# Patient Record
Sex: Female | Born: 1965 | Race: White | Hispanic: No | Marital: Married
Health system: Midwestern US, Academic
[De-identification: ages and names within clinical notes are randomized; demographics above are authoritative.]

---

## 2016-09-15 ENCOUNTER — Encounter: Admit: 2016-09-15 | Discharge: 2016-09-15

## 2016-09-15 DIAGNOSIS — E039 Hypothyroidism, unspecified: ICD-10-CM

## 2016-09-15 DIAGNOSIS — H547 Unspecified visual loss: Principal | ICD-10-CM

## 2016-09-15 DIAGNOSIS — M25461 Effusion, right knee: Principal | ICD-10-CM

## 2016-09-15 DIAGNOSIS — R69 Illness, unspecified: Principal | ICD-10-CM

## 2016-09-15 MED ORDER — MELOXICAM 15 MG PO TAB
15 mg | ORAL_TABLET | Freq: Every day | ORAL | 1 refills | 30.00000 days | Status: AC
Start: 2016-09-15 — End: 2018-04-25

## 2016-09-15 NOTE — Progress Notes
Patient Name: Christine Cowan  Date of Birth: 1965/03/30  [place] Medical Record Number:  [id number]    Date of Service: 09/15/2016    Chief Complaint:  Right knee pain    History of Present Illness:    This is the initial visit for this 51 y.o. year old female presenting in consultation from Dr. Carolee Rota  in reference to a lesion found in the right knee. Noted swelling in the right knee while at work in March. She went to [facility] and diagnosed her with a bakers cyst. The pain continued to be excruciating so she went to her PCP and had the cyst aspirated which helped some. The pain is severe and interfering with her activities of daily living. She is now requiring an assistive device, including crutches or walker.  She has similar pain in her left knee. The pain has been a lot better the last coupe of days since she has been taking tylenol. The pain radiates from the groin to the feet. There was no antecedent history of trauma nor change of activities to have produced the mass. She is on suboxone for pain control.     Other associated symptoms described in reference to the lesion include: Frequent bouts of swelling, stiffness and radiating pain.    The patient presented to their physician who performed appropriate examination and  radiographic studies. The studies demonstrated the presence of PVNS right knee.  The patient has been referred in consultation with regard to this lesion.     Past Medical History:       Past Medical History:   Diagnosis Date   ??? Acquired hypothyroidism    ??? Vision decreased        Past Surgical History:   History reviewed. No pertinent surgical history.    Current Medications:       Encounter Medications   Medications   ??? flaxseed oil (OMEGA 3 PO)     Sig: Take  by mouth.   ??? ferrous sulfate (IRON PO)     Sig: Take  by mouth.   ??? alprazolam (XANAX PO)     Sig: Take  by mouth.   ??? buprenorphine HCl (SUBUTEX SL)     Sig: Place  under tongue.   ??? meloxicam (MOBIC) 15 mg tablet Sig: Take 1 tablet by mouth daily.     Dispense:  60 tablet     Refill:  1       Allergies:     No Known Allergies    Social History:       Social History   Substance Use Topics   ??? Smoking status: Not on file   ??? Smokeless tobacco: Not on file   ??? Alcohol use Not on file        Family History:     History reviewed. No pertinent family history.    Review of Systems:     General: No adenopathy or cafe au lait spots. No bleeding diatheses, spontaneous bruising or other bleeding disorders.   Constitutional: No fevers, chills, weight loss, malaise, or other constitutional symptoms.   HEENT: Negative for trouble swallowing, sight issues pertaining to reasons to current visit, hearing problems related to this visit, hemoptysis.   Respiratory: Negative. Negative for cough and shortness of breath. No hemoptysis  Cardiovascular: Negative for chest pain, shortness of breath. or cardiogenic shock symptoms   Gastrointestinal: Negative for nausea, abdominal pain, diarrhea and constipation. No GI distress  Genitourinary: Negative. No hematuria  Endocrine: no night sweats, weight change, polyuria, polydipsia  Skin: Negative. Negative for rash.   Neurological: Negative for dizziness and headaches.   Psychiatric/Behavioral: Negative.   Musculoskeletal: Negative other than that pertaining to current tumor issue.        Physical Exam:       There were no vitals taken for this visit.  GEN: A&O. NAD   HEENT: PERRL, EOMI, No Adenopathy, no nodularity   NECK: Supple, symmetrical, No adenopathy  CV: Normal rate, regular rhythm   PULM: Non-labored. Breath sounds normal  ABD: Soft, non-distended, NTTP, +BSx4, no organomegaly  EXTREM: Examination of the right knee: moderate effusion, mild ROM limitations. Tenderness to palpation.   NEURO: Grossly intact. Motor function 5/5, sensation grossly intact.    ECOG SCORE: 2, Ambulatory and capable of all selfcare but unable to carry out any work activities. Up and about more than 50% of waking hours  KARNOFSKY SCORE: 80    XRays:        Impression:    Right knee pigmented villonodular synovitis (PVNS)  The considerations in the differential diagnosis includes many other synovitis type of processes including synovial chondromatosis and even inflammatory or autoimmune diseases.  However she has been having pain in this location along with swallowing on a frequent enough basis and on high severity level over a longer period of time that she does desire surgical intervention.  I explained to her that the surgical intervention would be an open arthrotomy of the anterior component of the lesion even though there is also posterior disease however the posterior disease does not seem to cause her any pain or problems.  However I also explained to her in great detail that any of these synovitis type of processes is more of a frustrating type of disease rather than a curative type of process and that even after open arthrotomy and synovectomy, local recurrence is a common occurrence.  Therefore this could be subjecting her to numerous need for additional surgeries.  I did recommend that we try a trial of meloxicam or other anti-inflammatories first before proceeding onto surgical intervention.  We also did discuss the possibility of arthroscopic debridement however due to the extent of disease and the unknown nature and the need for abundant tissue for diagnosis, and open arthrotomy is suggested.    I have discussed in detail with the patient, for approximately 30 minutes in length, the nature and natural history and treatment options. The patient asked appropriate questions answered to their satisfaction.  Discussion with regard to the recommended treatments as well as alternative treatments were also discussed.  Educational material was also provided in reference to this patient's particular situation.    Plan: The patient and family members present were educated as to the nature, natural history, and treatment options concerning this diagnosis.  I did discuss alternative treatment options as well as ramifications of no treatment.   We will attempt a trial of meloxicam since she had good relief the past few days with tylenol. If she continues to have pain we will   proceed with arthrotomy right knee with  partial synovectomy with tissue biopsy at that time.

## 2016-09-27 ENCOUNTER — Encounter: Admit: 2016-09-27 | Discharge: 2016-09-27

## 2016-09-27 NOTE — Telephone Encounter
PMH: Right knee pain, Right knee pigmented villonodular synovitis (PVNS)    Patient left VM stating the Meloxicam medication is "not working as well as hoped. May need to have surgery after all".  Will discuss with Dr. Collier Bullockosenthal and call the patient back with care plan.

## 2016-09-30 ENCOUNTER — Encounter: Admit: 2016-09-30 | Discharge: 2016-09-30

## 2016-09-30 NOTE — Telephone Encounter
Patient left a message stating that the meloxicam that she was to try for a couple weeks for her knee pain was not working.  She may have to schedule surgery after all she stated.  I returned her call and received her voicemail and left her message to call me back and we can discuss her concerns and possibilities for upcoming surgery.  Left call back information.

## 2016-10-14 ENCOUNTER — Encounter: Admit: 2016-10-14 | Discharge: 2016-10-14

## 2016-10-14 NOTE — Telephone Encounter
Received a call from the patient this morning stating she was returning my call from a couple weeks ago regarding setting up surgery.  She states that she is having severe pain in her left arm and up into her back and so she is meeting with the physician regarding that.  She thought maybe she should get that looked into before she has surgery on her knee.  I called her back and left a message informing her that it would be best for her to get that pain situation figured out and taking care of because if she has surgery she may need to use a walker or crutches which will definitely put pressure on that left arm.  Informed her that surgery on her knee is at her convenience.

## 2016-10-18 ENCOUNTER — Encounter: Admit: 2016-10-18 | Discharge: 2016-10-18

## 2016-10-18 DIAGNOSIS — R922 Inconclusive mammogram: Principal | ICD-10-CM

## 2016-10-20 ENCOUNTER — Encounter: Admit: 2016-10-20 | Discharge: 2016-10-20

## 2016-10-20 DIAGNOSIS — R69 Illness, unspecified: Principal | ICD-10-CM

## 2016-10-24 ENCOUNTER — Encounter: Admit: 2016-10-24 | Discharge: 2016-10-24

## 2016-10-28 ENCOUNTER — Encounter: Admit: 2016-10-28 | Discharge: 2016-10-28

## 2016-10-28 ENCOUNTER — Ambulatory Visit: Admit: 2016-10-28 | Discharge: 2016-10-28

## 2016-10-28 DIAGNOSIS — R922 Inconclusive mammogram: Principal | ICD-10-CM

## 2016-11-02 ENCOUNTER — Encounter: Admit: 2016-11-02 | Discharge: 2016-11-02

## 2016-11-02 NOTE — Telephone Encounter
Returning VM left by patient.  Asked patient to call back at her convenience.

## 2016-11-04 ENCOUNTER — Encounter: Admit: 2016-11-04 | Discharge: 2016-11-04

## 2016-12-26 ENCOUNTER — Encounter: Admit: 2016-12-26 | Discharge: 2016-12-26

## 2016-12-26 ENCOUNTER — Encounter: Admit: 2016-12-26 | Discharge: 2016-12-26 | Payer: BC Managed Care – PPO

## 2016-12-26 DIAGNOSIS — H547 Unspecified visual loss: Principal | ICD-10-CM

## 2016-12-26 DIAGNOSIS — M1220 Villonodular synovitis (pigmented), unspecified site: Principal | ICD-10-CM

## 2016-12-26 DIAGNOSIS — E039 Hypothyroidism, unspecified: ICD-10-CM

## 2016-12-26 NOTE — Progress Notes
Patient Name: Christine Cowan  Date of Birth: March 20, 1965  [facility] Medical Record Number:  [id number]        DATE OF SERVICE:  12/26/2016    ATTENDING PHYSICIAN:  Talbert Nan, MD.    DIAGNOSIS:  ??? Right knee joint pathologic synovial process likely PVNS vs less likely synovial chondromatosis, inflammatory/autoimmune disease    INTERVAL HISTORY: Christine Cowan presents in follow up in reference to her right knee pain and above diagnoses.  She presents today for further discussion of possible surgical intervention. Her pain has remained the same in quality and severity as when she was last seen, however she does endorse some mild temporary pain relief with the Meloxicam. She continues to have some swelling, radiating pain and stiffness about the right knee.    Also, she is starting to complain about some severe knee pain now involving the left knee that is similar to the pain as described above and sometimes even worse than the right knee at times.     REVIEW OF SYSTEMS: No fevers, chills, weight loss or other constitutional disorders. No chest pain, shortness of breath, hemoptysis, bleeding diatheses, or other pulmonary or hematological problems. No adenopathy, unexpected swelling, cardiac, neurologic, or GI problems.    PHYSICAL EXAMINATION:  General:  Awake, alert, and oriented x3, in no acute distress, seated in a chair in exam room.  Musculoskeletal:  Right knee - moderate effusion, mild ROM limitations. Appropriate strength. Tenderness to palpation.     RADIOGRAPHS:        ASSESSMENT:    Right knee pigmented villonodular synovitis (PVNS)  The considerations in the differential diagnosis includes many other synovitis type of processes including synovial chondromatosis and even inflammatory or autoimmune diseases.  However she has been having pain in this location along with swallowing on a frequent enough basis and on high severity level over a longer period of time that she does desire surgical intervention.  I explained to her that the surgical intervention would be an open arthrotomy of the anterior component of the lesion even though there is also posterior disease however the posterior disease does not seem to cause her any pain or problems.  However I also explained to her in great detail that any of these synovitis type of processes is more of a frustrating type of disease rather than a curative type of process and that even after open arthrotomy and synovectomy, local recurrence is a common occurrence.  Therefore this could be subjecting her to numerous need for additional surgeries.     We are starting to see some pain relief with current anti-inflammatory medication.  Today, we discussed the possibility of arthroscopic debridement however due to the extent of disease and the unknown nature and the need for abundant tissue for diagnosis, and open arthrotomy is suggested.       PLAN:   We discussed that we will repeat MRI scans of the bilateral knees given her slight change in symptoms and now involvement of the left knee. She will continue to take her Meloxicam in the mean time as it is providing some relief.   We will call her with the results of the MRIs and will see her back in 6 months with repeat XR of the bilateral knees and repeat clinical examination.

## 2016-12-29 ENCOUNTER — Encounter: Admit: 2016-12-29 | Discharge: 2016-12-29

## 2016-12-29 DIAGNOSIS — R52 Pain, unspecified: Principal | ICD-10-CM

## 2017-01-05 ENCOUNTER — Encounter: Admit: 2017-01-05 | Discharge: 2017-01-06

## 2017-01-05 DIAGNOSIS — R69 Illness, unspecified: Principal | ICD-10-CM

## 2017-01-05 LAB — COMPREHENSIVE METABOLIC PANEL

## 2017-01-06 ENCOUNTER — Encounter: Admit: 2017-01-06 | Discharge: 2017-01-06

## 2017-01-06 DIAGNOSIS — R52 Pain, unspecified: Principal | ICD-10-CM

## 2017-01-09 ENCOUNTER — Encounter: Admit: 2017-01-09 | Discharge: 2017-01-10

## 2017-01-09 DIAGNOSIS — R69 Illness, unspecified: Principal | ICD-10-CM

## 2017-01-10 ENCOUNTER — Encounter: Admit: 2017-01-10 | Discharge: 2017-01-10

## 2017-01-10 DIAGNOSIS — R69 Illness, unspecified: Principal | ICD-10-CM

## 2017-01-12 ENCOUNTER — Encounter: Admit: 2017-01-12 | Discharge: 2017-01-12

## 2017-01-12 DIAGNOSIS — M1220 Villonodular synovitis (pigmented), unspecified site: Principal | ICD-10-CM

## 2017-01-16 ENCOUNTER — Encounter: Admit: 2017-01-16 | Discharge: 2017-01-16

## 2017-01-16 NOTE — Telephone Encounter
Left detailed VM for patient regarding bilateral knee MRI's recently performed and reviewed by Dr. Collier Bullockosenthal.  Explained that are several tears in bilateral knee ligaments, osteoarthritis and Baker's cysts, explained that Dr. Collier Bullockosenthal recommends she see a orthopedic knee specialist. Explained that she can either call back and a referral can be placed to [facility] or if she would prefer to go somewhere closer to her home then that is ok as well.

## 2017-01-16 NOTE — Telephone Encounter
-----   Message from Talbert NanHoward G Rosenthal, MD sent at 01/12/2017  1:22 PM CST -----   No sign of recurrence of PVNS but intra-articular pathology for meniscus noted.  If she wants send her to sports medicine for consideration of scope  ----- Message -----  From: Claudette StaplerSlimmer, Nashly Olsson, RN  Sent: 01/12/2017   6:26 AM  To: Talbert NanHoward G Rosenthal, MD

## 2017-05-25 ENCOUNTER — Encounter: Admit: 2017-05-25 | Discharge: 2017-05-25

## 2017-05-25 DIAGNOSIS — M25561 Pain in right knee: Principal | ICD-10-CM

## 2017-06-12 ENCOUNTER — Encounter: Admit: 2017-06-12 | Discharge: 2017-06-12

## 2017-07-07 ENCOUNTER — Encounter: Admit: 2017-07-07 | Discharge: 2017-07-07

## 2017-07-07 ENCOUNTER — Ambulatory Visit: Admit: 2017-07-07 | Discharge: 2017-07-08 | Payer: BC Managed Care – PPO

## 2017-07-07 DIAGNOSIS — E039 Hypothyroidism, unspecified: ICD-10-CM

## 2017-07-07 DIAGNOSIS — M1220 Villonodular synovitis (pigmented), unspecified site: ICD-10-CM

## 2017-07-07 DIAGNOSIS — M25561 Pain in right knee: Principal | ICD-10-CM

## 2017-07-07 DIAGNOSIS — H547 Unspecified visual loss: Principal | ICD-10-CM

## 2017-07-12 ENCOUNTER — Encounter: Admit: 2017-07-12 | Discharge: 2017-07-12

## 2018-02-16 ENCOUNTER — Encounter: Admit: 2018-02-16 | Discharge: 2018-02-16

## 2018-02-19 ENCOUNTER — Encounter: Admit: 2018-02-19 | Discharge: 2018-02-19

## 2018-02-19 DIAGNOSIS — Z1231 Encounter for screening mammogram for malignant neoplasm of breast: Principal | ICD-10-CM

## 2018-02-20 ENCOUNTER — Encounter: Admit: 2018-02-20 | Discharge: 2018-02-20

## 2018-02-20 DIAGNOSIS — M1220 Villonodular synovitis (pigmented), unspecified site: Principal | ICD-10-CM

## 2018-03-08 ENCOUNTER — Encounter: Admit: 2018-03-08 | Discharge: 2018-03-08

## 2018-03-08 ENCOUNTER — Ambulatory Visit: Admit: 2018-03-08 | Discharge: 2018-03-09

## 2018-03-08 ENCOUNTER — Ambulatory Visit: Admit: 2018-03-08 | Discharge: 2018-03-08 | Payer: BC Managed Care – PPO

## 2018-03-08 ENCOUNTER — Encounter: Admit: 2018-03-08 | Discharge: 2018-03-08 | Payer: BC Managed Care – PPO

## 2018-03-08 DIAGNOSIS — M1220 Villonodular synovitis (pigmented), unspecified site: Secondary | ICD-10-CM

## 2018-03-08 DIAGNOSIS — H547 Unspecified visual loss: Secondary | ICD-10-CM

## 2018-03-08 DIAGNOSIS — Z1231 Encounter for screening mammogram for malignant neoplasm of breast: Secondary | ICD-10-CM

## 2018-03-08 DIAGNOSIS — E039 Hypothyroidism, unspecified: Secondary | ICD-10-CM

## 2018-03-08 MED ORDER — SODIUM CHLORIDE 0.9 % IJ SOLN
50 mL | Freq: Once | INTRAVENOUS | 0 refills | Status: DC
Start: 2018-03-08 — End: 2018-03-13

## 2018-03-08 MED ORDER — GADOBENATE DIMEGLUMINE 529 MG/ML (0.1MMOL/0.2ML) IV SOLN
16 mL | Freq: Once | INTRAVENOUS | 0 refills | Status: CP
Start: 2018-03-08 — End: ?
  Administered 2018-03-08: 17:00:00 16 mL via INTRAVENOUS

## 2018-03-16 ENCOUNTER — Encounter: Admit: 2018-03-16 | Discharge: 2018-03-16

## 2018-03-20 ENCOUNTER — Encounter: Admit: 2018-03-20 | Discharge: 2018-03-20

## 2018-03-20 DIAGNOSIS — M1220 Villonodular synovitis (pigmented), unspecified site: Secondary | ICD-10-CM

## 2018-04-03 ENCOUNTER — Encounter: Admit: 2018-04-03 | Discharge: 2018-04-03

## 2018-04-03 ENCOUNTER — Ambulatory Visit: Admit: 2018-04-03 | Discharge: 2018-04-04 | Payer: BC Managed Care – PPO

## 2018-04-03 DIAGNOSIS — E039 Hypothyroidism, unspecified: Secondary | ICD-10-CM

## 2018-04-03 DIAGNOSIS — S83241A Other tear of medial meniscus, current injury, right knee, initial encounter: Secondary | ICD-10-CM

## 2018-04-03 DIAGNOSIS — H547 Unspecified visual loss: Secondary | ICD-10-CM

## 2018-04-03 DIAGNOSIS — M1220 Villonodular synovitis (pigmented), unspecified site: Secondary | ICD-10-CM

## 2018-04-10 ENCOUNTER — Encounter: Admit: 2018-04-10 | Discharge: 2018-04-10

## 2018-04-12 ENCOUNTER — Encounter: Admit: 2018-04-12 | Discharge: 2018-04-12

## 2018-04-12 DIAGNOSIS — S83241A Other tear of medial meniscus, current injury, right knee, initial encounter: Principal | ICD-10-CM

## 2018-04-12 DIAGNOSIS — M25561 Pain in right knee: ICD-10-CM

## 2018-04-12 DIAGNOSIS — M1220 Villonodular synovitis (pigmented), unspecified site: ICD-10-CM

## 2018-04-12 MED ORDER — CEFAZOLIN INJ 1GM IVP
2 g | Freq: Once | INTRAVENOUS | 0 refills | Status: CN
Start: 2018-04-12 — End: ?

## 2018-04-25 ENCOUNTER — Encounter: Admit: 2018-04-25 | Discharge: 2018-04-25

## 2018-04-25 DIAGNOSIS — M1220 Villonodular synovitis (pigmented), unspecified site: ICD-10-CM

## 2018-04-25 DIAGNOSIS — H547 Unspecified visual loss: Principal | ICD-10-CM

## 2018-04-25 DIAGNOSIS — F329 Major depressive disorder, single episode, unspecified: ICD-10-CM

## 2018-04-25 DIAGNOSIS — M7120 Synovial cyst of popliteal space [Baker], unspecified knee: ICD-10-CM

## 2018-04-25 DIAGNOSIS — E039 Hypothyroidism, unspecified: ICD-10-CM

## 2018-04-25 DIAGNOSIS — F419 Anxiety disorder, unspecified: ICD-10-CM

## 2018-04-25 NOTE — Pre-Anesthesia Patient Instructions
Notify us at [employer]: [phone]   ??? if you need to cancel your procedure  ??? if you are going to be late    Arrival at the hospital  [place] - Your surgery is scheduled on 05-09-18 at *.  Please arrive at *.  ??? The [address] is located at [employer]. This is at the [employer] of [address]  ??? Use the main entrance of the hospital, at the east side of the building. Parking is free.  ??? Check-in for surgery is inside the main entrance.

## 2018-04-30 ENCOUNTER — Encounter: Admit: 2018-04-30 | Discharge: 2018-04-30

## 2018-04-30 ENCOUNTER — Encounter: Admit: 2018-04-30 | Discharge: 2018-04-30 | Payer: BC Managed Care – PPO

## 2018-04-30 DIAGNOSIS — F419 Anxiety disorder, unspecified: ICD-10-CM

## 2018-04-30 DIAGNOSIS — M1220 Villonodular synovitis (pigmented), unspecified site: Principal | ICD-10-CM

## 2018-04-30 DIAGNOSIS — F329 Major depressive disorder, single episode, unspecified: ICD-10-CM

## 2018-04-30 DIAGNOSIS — M7120 Synovial cyst of popliteal space [Baker], unspecified knee: ICD-10-CM

## 2018-04-30 DIAGNOSIS — H547 Unspecified visual loss: Principal | ICD-10-CM

## 2018-04-30 DIAGNOSIS — E039 Hypothyroidism, unspecified: ICD-10-CM

## 2018-04-30 MED ORDER — PREDNISONE 50 MG PO TAB
50 mg | ORAL_TABLET | Freq: Every day | ORAL | 0 refills | Status: AC
Start: 2018-04-30 — End: 2018-05-10

## 2018-04-30 NOTE — Progress Notes
??? IBUPROFEN PO Take 800 mg by mouth as Needed.   ??? levothyroxine (SYNTHROID) 125 mcg tablet Take 125 mcg by mouth daily 30 minutes before breakfast.   ??? predniSONE (DELTASONE) 50 mg tablet Take one tablet by mouth daily with breakfast.     Vitals:    04/30/18 1359   BP: (!) 140/82   BP Source: Arm, Left Upper   Patient Position: Sitting   Pulse: 84   Resp: 16   Temp: 36.7 ???C (98.1 ???F)   TempSrc: Oral   SpO2: 100%   Weight: 77.8 kg (171 lb 9.6 oz)   Height: 170.2 cm (67)   PainSc: Ten     Body mass index is 26.88 kg/m???.     Pain Score: Ten  Pain Loc: Generalized(legs7, shingles pain 7)    Fatigue Scale: 5    Pain Addressed:  Prescription provided for pain management and Patient to call office if pain not relieved or worsened    Patient Evaluated for a Clinical Trial: No treatment clinical trial available for this patient.     [place] Cooperative Oncology Group performance status is 1, Restricted in physically strenuous activity but ambulatory and able to carry out work of a light or sedentary nature, e.g., light house work, office work.     Physical Exam  Constitutional:       General: She is in acute distress.      Appearance: Normal appearance. She is well-developed.   HENT:      Head: Atraumatic.      Mouth/Throat:      Pharynx: No oropharyngeal exudate.   Eyes:      General: No scleral icterus.  Neck:      Musculoskeletal: Neck supple.   Cardiovascular:      Rate and Rhythm: Normal rate and regular rhythm.   Pulmonary:      Effort: Pulmonary effort is normal. No respiratory distress.      Breath sounds: Normal breath sounds.   Abdominal:      General: Bowel sounds are normal.      Palpations: Abdomen is soft. There is no mass.   Musculoskeletal:         General: No tenderness or deformity.      Comments: Varicose veins of BLE   Lymphadenopathy:      Cervical: No cervical adenopathy.   Skin:     General: Skin is warm and dry.      Coloration: Skin is not pale.      Findings: No erythema.   Neurological: everything with her grandkids then. She has had some natural doctor in the past tell her she has lupus, but then she went and saw a rheumatologist who denied the lupus claim.    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 No desire to start her on directed PVNS-therapy like imatinib or pexidartinib until I have a biopsy-proven diagnosis of PVNS. With all of her story, I am much more worried about some sort of autoimmune or inflammatory-mediated arthritis  in causing all of her pains, not just R knee. I may ask her to see rheum again, for another opinion, if she responds well to a 5d burst of prednisone 50mg  daily, heading into surgery again next week. Will plan on following up with her after the surgery, come up with ongoing plan based on path. Discussed with the patient and all questions fully answered. She will call me if any problems arise.

## 2018-05-03 ENCOUNTER — Encounter: Admit: 2018-05-03 | Discharge: 2018-05-03

## 2018-05-07 ENCOUNTER — Encounter: Admit: 2018-05-07 | Discharge: 2018-05-07

## 2018-05-07 DIAGNOSIS — M659 Synovitis and tenosynovitis, unspecified: Principal | ICD-10-CM

## 2018-05-07 DIAGNOSIS — Z9889 Other specified postprocedural states: ICD-10-CM

## 2018-05-07 DIAGNOSIS — S83241D Other tear of medial meniscus, current injury, right knee, subsequent encounter: ICD-10-CM

## 2018-05-07 NOTE — Progress Notes
Post-op physical therapy orders/protocol faxed to patients preferred location.    [facility]  Fax: [phone]

## 2018-05-08 NOTE — H&P (View-Only)
???   Acquired hypothyroidism    ??? Anxiety    ??? Baker's cyst    ??? Depression    ??? PVNS (pigmented villonodular synovitis)    ??? Vision decreased        History reviewed. No pertinent surgical history.    Family History:  Noncontributory    Social History     Socioeconomic History   ??? Marital status: Married     Spouse name: Not on file   ??? Number of children: Not on file   ??? Years of education: Not on file   ??? Highest education level: Not on file   Occupational History   ??? Not on file   Tobacco Use   ??? Smoking status: Former Smoker     Last attempt to quit: 04/25/2012     Years since quitting: 6.0   ??? Smokeless tobacco: Never Used   Substance and Sexual Activity   ??? Alcohol use: No   ??? Drug use: No   ??? Sexual activity: Not on file   Other Topics Concern   ??? Not on file   Social History Narrative   ??? Not on file       Allergies:  Patient has no known allergies.    No current facility-administered medications for this encounter.      Current Outpatient Medications   Medication Sig   ??? ALPRAZolam (XANAX) 1 mg tablet Take 1 mg by mouth three times daily.   ??? buprenorphine/naloxone (SUBOXONE) 8/2 mg sublingual film Place 1 Film under tongue three times daily.   ??? flaxseed oil (OMEGA 3 PO) Take  by mouth.   ??? gabapentin 300 mg Tb24 Take 300 mg by mouth daily.   ??? IBUPROFEN PO Take 800 mg by mouth as Needed.   ??? levothyroxine (SYNTHROID) 125 mcg tablet Take 125 mcg by mouth daily 30 minutes before breakfast.   ??? lidocaine (LIDODERM) 5 % topical patch Apply 1 patch topically to affected area every 24 hours. Apply patch for 12 hours, then remove for 12 hours before repeating.   ??? predniSONE (DELTASONE) 50 mg tablet Take one tablet by mouth daily with breakfast.   ??? traMADoL (ULTRAM) 50 mg tablet Take 50 mg by mouth every 6 hours as needed for Pain.       Review of Systems -   All other systems reviewed and are negative.    Physical Exam:       General:  This is a well developed, well nourished person, sitting in no

## 2018-05-09 ENCOUNTER — Encounter: Admit: 2018-05-09 | Discharge: 2018-05-09

## 2018-05-09 ENCOUNTER — Ambulatory Visit: Admit: 2018-05-09 | Discharge: 2018-05-09

## 2018-05-09 DIAGNOSIS — F419 Anxiety disorder, unspecified: ICD-10-CM

## 2018-05-09 DIAGNOSIS — M1220 Villonodular synovitis (pigmented), unspecified site: ICD-10-CM

## 2018-05-09 DIAGNOSIS — E039 Hypothyroidism, unspecified: ICD-10-CM

## 2018-05-09 DIAGNOSIS — F329 Major depressive disorder, single episode, unspecified: ICD-10-CM

## 2018-05-09 DIAGNOSIS — H547 Unspecified visual loss: Principal | ICD-10-CM

## 2018-05-09 DIAGNOSIS — M7120 Synovial cyst of popliteal space [Baker], unspecified knee: ICD-10-CM

## 2018-05-09 MED ORDER — MEPERIDINE (PF) 25 MG/ML IJ SYRG
12.5 mg | INTRAVENOUS | 0 refills | Status: DC | PRN
Start: 2018-05-09 — End: 2018-05-10

## 2018-05-09 MED ORDER — ONDANSETRON HCL (PF) 4 MG/2 ML IJ SOLN
INTRAVENOUS | 0 refills | Status: DC
Start: 2018-05-09 — End: 2018-05-09
  Administered 2018-05-09: 22:00:00 4 mg via INTRAVENOUS

## 2018-05-09 MED ORDER — MIDAZOLAM 1 MG/ML IJ SOLN
INTRAVENOUS | 0 refills | Status: DC
Start: 2018-05-09 — End: 2018-05-09
  Administered 2018-05-09: 21:00:00 2 mg via INTRAVENOUS

## 2018-05-09 MED ORDER — CEFAZOLIN INJ 1GM IVP
2 g | Freq: Once | INTRAVENOUS | 0 refills | Status: CP
Start: 2018-05-09 — End: ?
  Administered 2018-05-09: 21:00:00 2 g via INTRAVENOUS

## 2018-05-09 MED ORDER — LIDOCAINE (PF) 200 MG/10 ML (2 %) IJ SYRG
0 refills | Status: DC
Start: 2018-05-09 — End: 2018-05-09

## 2018-05-09 MED ORDER — PROPOFOL INJ 10 MG/ML IV VIAL
0 refills | Status: DC
Start: 2018-05-09 — End: 2018-05-09
  Administered 2018-05-09: 21:00:00 200 mg via INTRAVENOUS

## 2018-05-09 MED ORDER — PROMETHAZINE 25 MG/ML IJ SOLN
6.25 mg | INTRAVENOUS | 0 refills | Status: DC | PRN
Start: 2018-05-09 — End: 2018-05-10

## 2018-05-09 MED ORDER — FENTANYL CITRATE (PF) 50 MCG/ML IJ SOLN
25 ug | INTRAVENOUS | 0 refills | Status: DC | PRN
Start: 2018-05-09 — End: 2018-05-10

## 2018-05-09 MED ORDER — LIDOCAINE (PF) 10 MG/ML (1 %) IJ SOLN
.1-2 mL | INTRAMUSCULAR | 0 refills | Status: DC | PRN
Start: 2018-05-09 — End: 2018-05-10
  Administered 2018-05-09: 19:00:00 2 mL via INTRAMUSCULAR

## 2018-05-09 MED ORDER — TRIAMCINOLONE ACETONIDE 40 MG/ML IJ SUSP
0 refills | Status: DC
Start: 2018-05-09 — End: 2018-05-09
  Administered 2018-05-09: 22:00:00 80 mg via INTRAMUSCULAR

## 2018-05-09 MED ORDER — FENTANYL CITRATE (PF) 50 MCG/ML IJ SOLN
50 ug | INTRAVENOUS | 0 refills | Status: DC | PRN
Start: 2018-05-09 — End: 2018-05-10
  Administered 2018-05-09: 22:00:00 50 ug via INTRAVENOUS

## 2018-05-09 MED ORDER — LACTATED RINGERS IV SOLP
0 refills | Status: DC
Start: 2018-05-09 — End: 2018-05-09
  Administered 2018-05-09: 20:00:00 via INTRAVENOUS

## 2018-05-09 MED ORDER — LACTATED RINGERS IV SOLP
1000 mL | Freq: Once | INTRAVENOUS | 0 refills | Status: CP
Start: 2018-05-09 — End: ?
  Administered 2018-05-09: 19:00:00 1000 mL via INTRAVENOUS

## 2018-05-09 MED ORDER — OXYCODONE 5 MG PO TAB
5 mg | Freq: Once | ORAL | 0 refills | Status: DC
Start: 2018-05-09 — End: 2018-05-10

## 2018-05-09 MED ORDER — OXYCODONE 5 MG PO TAB
5-10 mg | Freq: Once | ORAL | 0 refills | Status: CP | PRN
Start: 2018-05-09 — End: ?
  Administered 2018-05-09: 23:00:00 5 mg via ORAL

## 2018-05-09 MED ORDER — HALOPERIDOL LACTATE 5 MG/ML IJ SOLN
1 mg | Freq: Once | INTRAVENOUS | 0 refills | Status: DC | PRN
Start: 2018-05-09 — End: 2018-05-10

## 2018-05-09 MED ORDER — DIAZEPAM 5 MG PO TAB
5 mg | Freq: Once | ORAL | 0 refills | Status: DC
Start: 2018-05-09 — End: 2018-05-10

## 2018-05-09 MED ORDER — FENTANYL CITRATE (PF) 50 MCG/ML IJ SOLN
0 refills | Status: DC
Start: 2018-05-09 — End: 2018-05-09
  Administered 2018-05-09 (×3): 25 ug via INTRAVENOUS
  Administered 2018-05-09 (×2): 50 ug via INTRAVENOUS
  Administered 2018-05-09: 22:00:00 25 ug via INTRAVENOUS

## 2018-05-09 MED ORDER — HYDROCORTISONE SOD SUCC (PF) 100 MG/2 ML IJ SOLR
0 refills | Status: DC
Start: 2018-05-09 — End: 2018-05-09
  Administered 2018-05-09: 21:00:00 100 mg via INTRAVENOUS

## 2018-05-09 MED ORDER — HYDROCODONE-ACETAMINOPHEN 10-325 MG PO TAB
1-2 | ORAL_TABLET | ORAL | 0 refills | 30.00000 days | Status: AC | PRN
Start: 2018-05-09 — End: 2018-06-05
  Filled 2018-05-09 (×2): qty 50, 5d supply, fill #1

## 2018-05-09 MED ORDER — ONDANSETRON HCL 4 MG PO TAB
4 mg | ORAL_TABLET | ORAL | 0 refills | 8.00000 days | Status: AC | PRN
Start: 2018-05-09 — End: 2018-06-05
  Filled 2018-05-09 (×2): qty 30, 10d supply, fill #1

## 2018-05-09 MED ORDER — PROPOFOL 10 MG/ML IV EMUL (INFUSION)(AM)(OR)
0 refills | Status: DC
Start: 2018-05-09 — End: 2018-05-09
  Administered 2018-05-09: 21:00:00 160 ug/kg/min via INTRAVENOUS

## 2018-05-09 MED ORDER — FENTANYL CITRATE (PF) 50 MCG/ML IJ SOLN
50 ug | INTRAVENOUS | 0 refills | Status: DC | PRN
Start: 2018-05-09 — End: 2018-05-10
  Administered 2018-05-09 (×3): 50 ug via INTRAVENOUS

## 2018-05-09 MED ORDER — ROPIVACAINE (PF) 2 MG/ML (0.2 %) IJ SOLN
0 refills | Status: DC
Start: 2018-05-09 — End: 2018-05-09
  Administered 2018-05-09: 22:00:00 20 mL via INTRAMUSCULAR

## 2018-05-09 MED ADMIN — OXYCODONE 5 MG PO TAB [10814]: 5 mg | ORAL | Stop: 2018-05-09 | NDC 00406055223

## 2018-05-09 MED ADMIN — DIAZEPAM 5 MG PO TAB [2405]: 5 mg | ORAL | Stop: 2018-05-09 | NDC 00904588061

## 2018-05-09 NOTE — Interval H&P Note
No changes in H/P since last seen in clinic.     Plan: R knee arthroscopy, partial medial meniscectomy.    Posted/consented  NPO      Tillie Fantasia, MD  [phone]

## 2018-05-09 NOTE — Patient Instructions
Discharge Instructions: After Your Surgery  You???ve just had surgery. During surgery, you were given medicine called anesthesia to keep you relaxed and free of pain. After surgery, you may have some pain or nausea. This is common. Here are some tips for feeling better and getting well after surgery.     Stay on schedule with your medicine.   Going home  Your healthcare provider will show you how to take care of yourself when you go home. He or she will also answer your questions. Have an adult family member or friend drive you home. For the first 24 hours after your surgery:  ??? Don't drive or use heavy equipment.  ??? Don't make important decisions or sign legal papers.  ??? Don't drink alcohol.  ??? Have someone stay with you, if needed. He or she can watch for problems and help keep you safe.  Be sure to go to all follow-up visits with your healthcare provider. And rest after your surgery for as long as your healthcare provider tells you to.  Coping with pain  If you have pain after surgery, pain medicine will help you feel better. Take it as told, before pain becomes severe. Also, ask your healthcare provider or pharmacist about other ways to control pain. This might be with heat, ice, or relaxation. And follow any other instructions your surgeon or nurse gives you.  Tips for taking pain medicine  To get the best relief possible, remember these points:  ??? Pain medicines can upset your stomach. Taking them with a little food may help.  ??? Most pain relievers taken by mouth need at least 20 to 30 minutes to start to work.  ??? Don't wait till your pain becomes severe before you take your medicine. Try to time your medicine so that you can take it before starting an activity. This might be before you get dressed, go for a walk, or sit down for dinner.  ??? Constipation is a common side effect of pain medicines. Call your healthcare provider before taking any medicines such as laxatives or stool

## 2018-05-10 ENCOUNTER — Encounter: Admit: 2018-05-10 | Discharge: 2018-05-10

## 2018-05-10 ENCOUNTER — Ambulatory Visit: Admit: 2018-05-09 | Discharge: 2018-05-10 | Payer: BC Managed Care – PPO

## 2018-05-10 DIAGNOSIS — E039 Hypothyroidism, unspecified: ICD-10-CM

## 2018-05-10 DIAGNOSIS — Z87891 Personal history of nicotine dependence: ICD-10-CM

## 2018-05-10 DIAGNOSIS — Z791 Long term (current) use of non-steroidal anti-inflammatories (NSAID): ICD-10-CM

## 2018-05-10 DIAGNOSIS — Z79899 Other long term (current) drug therapy: ICD-10-CM

## 2018-05-10 DIAGNOSIS — S83231A Complex tear of medial meniscus, current injury, right knee, initial encounter: Principal | ICD-10-CM

## 2018-05-10 DIAGNOSIS — M1220 Villonodular synovitis (pigmented), unspecified site: ICD-10-CM

## 2018-05-10 DIAGNOSIS — M94261 Chondromalacia, right knee: ICD-10-CM

## 2018-05-11 ENCOUNTER — Encounter: Admit: 2018-05-11 | Discharge: 2018-05-11

## 2018-05-11 DIAGNOSIS — M7120 Synovial cyst of popliteal space [Baker], unspecified knee: ICD-10-CM

## 2018-05-11 DIAGNOSIS — F329 Major depressive disorder, single episode, unspecified: ICD-10-CM

## 2018-05-11 DIAGNOSIS — E039 Hypothyroidism, unspecified: ICD-10-CM

## 2018-05-11 DIAGNOSIS — H547 Unspecified visual loss: Principal | ICD-10-CM

## 2018-05-11 DIAGNOSIS — F419 Anxiety disorder, unspecified: ICD-10-CM

## 2018-05-11 DIAGNOSIS — M1220 Villonodular synovitis (pigmented), unspecified site: ICD-10-CM

## 2018-05-14 ENCOUNTER — Encounter: Admit: 2018-05-14 | Discharge: 2018-05-14

## 2018-05-17 ENCOUNTER — Encounter: Admit: 2018-05-17 | Discharge: 2018-05-17

## 2018-05-18 ENCOUNTER — Encounter: Admit: 2018-05-18 | Discharge: 2018-05-18

## 2018-05-18 ENCOUNTER — Ambulatory Visit: Admit: 2018-05-18 | Discharge: 2018-05-19 | Payer: BC Managed Care – PPO

## 2018-05-18 DIAGNOSIS — M1220 Villonodular synovitis (pigmented), unspecified site: ICD-10-CM

## 2018-05-18 DIAGNOSIS — M7120 Synovial cyst of popliteal space [Baker], unspecified knee: ICD-10-CM

## 2018-05-18 DIAGNOSIS — E039 Hypothyroidism, unspecified: ICD-10-CM

## 2018-05-18 DIAGNOSIS — H547 Unspecified visual loss: Principal | ICD-10-CM

## 2018-05-18 DIAGNOSIS — F419 Anxiety disorder, unspecified: ICD-10-CM

## 2018-05-18 DIAGNOSIS — F329 Major depressive disorder, single episode, unspecified: ICD-10-CM

## 2018-05-18 DIAGNOSIS — Z9889 Other specified postprocedural states: ICD-10-CM

## 2018-05-18 NOTE — Progress Notes
Date of Service: 05/18/2018     History of Present Illness  Christine Cowan is a 53 y.o. female who presents s/p right knee partial medial meniscectomy on 05/09/18.  She states that overall she has been doing well since the surgery.  Notes mild decreased range of motion.  Has been ambulating with crutches.  Denies any wound drainage or fevers.  No other questions/concerns.       Chief Complaint   Patient presents with   ??? Right Knee - Follow Up          Review of Systems   Constitutional: Positive for activity change.   Endocrine: Positive for polydipsia.   Musculoskeletal: Positive for back pain.   Psychiatric/Behavioral: Positive for agitation.       Medical History:   Diagnosis Date   ??? Acquired hypothyroidism    ??? Anxiety    ??? Baker's cyst    ??? Depression    ??? PVNS (pigmented villonodular synovitis)    ??? Vision decreased      Surgical History:   Procedure Laterality Date   ??? ARTHROSCOPY KNEE WITH PARTIAL MEDIAL MENISCECTOMY , Right 05/09/2018    Performed by Caryl Comes, MD at [facility]   ??? ARTHROSCOPY KNEE WITH PARTIAL MEDIAL MENISCECTOMY , Right 05/09/2018    Performed by Caryl Comes, MD at [facility]     ALLERGIES:  Patient has no known allergies.      Objective:         ??? acetaminophen (TYLENOL) 500 mg tablet Take 500 mg by mouth every 6 hours as needed for Pain. Max of 4,000 mg of acetaminophen in 24 hours.   ??? ALPRAZolam (XANAX) 1 mg tablet Take 1 mg by mouth three times daily.   ??? buprenorphine/naloxone (SUBOXONE) 8/2 mg sublingual film Place 1 Film under tongue three times daily.   ??? flaxseed oil (OMEGA 3 PO) Take  by mouth.   ??? gabapentin 300 mg Tb24 Take 300 mg by mouth daily.   ??? HYDROcodone/acetaminophen (NORCO) 10/325 mg tablet Take one tablet to two tablets by mouth every 4-6 hours as needed for Pain Indications: pain  DO NOT EXCEED MORE THAN 4 GRAMS OF ACETAMINOPHEN PER DAY   ??? IBUPROFEN PO Take 800 mg by mouth as Needed.   ??? levothyroxine (SYNTHROID) 125 mcg tablet Take 125 mcg by mouth daily 30

## 2018-05-19 DIAGNOSIS — M25561 Pain in right knee: Principal | ICD-10-CM

## 2018-05-19 DIAGNOSIS — M25461 Effusion, right knee: ICD-10-CM

## 2018-05-22 MED ORDER — ROPIVACAINE (PF) 5 MG/ML (0.5 %) IJ SOLN
5 mL | Freq: Once | INTRAMUSCULAR | 0 refills | Status: CP | PRN
Start: 2018-05-22 — End: ?
  Administered 2018-05-18: 20:00:00 5 mL via INTRAMUSCULAR

## 2018-05-22 MED ORDER — TRIAMCINOLONE ACETONIDE 40 MG/ML IJ SUSP
80 mg | Freq: Once | INTRAMUSCULAR | 0 refills | Status: CP | PRN
Start: 2018-05-22 — End: ?
  Administered 2018-05-18: 20:00:00 80 mg via INTRAMUSCULAR

## 2018-06-04 ENCOUNTER — Encounter: Admit: 2018-06-04 | Discharge: 2018-06-04

## 2018-06-04 MED ORDER — ONDANSETRON HCL 4 MG PO TAB
4 mg | ORAL_TABLET | ORAL | 0 refills | 8.00000 days | Status: AC | PRN
Start: 2018-06-04 — End: ?

## 2018-06-04 MED ORDER — HYDROCODONE-ACETAMINOPHEN 5-325 MG PO TAB
1-2 | ORAL_TABLET | ORAL | 0 refills | 15.00000 days | Status: AC | PRN
Start: 2018-06-04 — End: ?

## 2018-06-04 MED ORDER — HYDROCODONE-ACETAMINOPHEN 5-325 MG PO TAB
1-2 | ORAL_TABLET | ORAL | 0 refills | 30.00000 days | Status: AC | PRN
Start: 2018-06-04 — End: 2018-06-05

## 2018-06-04 MED ORDER — ONDANSETRON HCL 4 MG PO TAB
4 mg | ORAL_TABLET | ORAL | 0 refills | 8.00000 days | Status: AC | PRN
Start: 2018-06-04 — End: 2018-06-05

## 2018-06-05 NOTE — Telephone Encounter
Patient contacted office requesting refill of pain medication. Patient is on Prednisone at this time that she received from PCP. Per D. Silkman, PA-C refill sent to patients preferred pharmacy. Patient is using ice and compression at this time.

## 2018-06-15 ENCOUNTER — Encounter: Admit: 2018-06-15 | Discharge: 2018-06-15

## 2018-06-21 ENCOUNTER — Encounter: Admit: 2018-06-21 | Discharge: 2018-06-21

## 2018-06-21 NOTE — Telephone Encounter
Patient calling because she is still struggling with pain and swelling bilaterally in knees. Patient has an appointment with Dr. Freada Bergeron tomorrow and I encouraged patient to attend that appointment for next steps. Also informed patient of Dr. Lorenso Courier' recommendations for visit with rheumatologist if still having problems and informed her that her PCP can help manage this as well.

## 2018-06-22 ENCOUNTER — Ambulatory Visit: Admit: 2018-06-22 | Discharge: 2018-06-23 | Payer: BC Managed Care – PPO

## 2018-06-22 ENCOUNTER — Encounter: Admit: 2018-06-22 | Discharge: 2018-06-22

## 2018-06-22 DIAGNOSIS — M7120 Synovial cyst of popliteal space [Baker], unspecified knee: ICD-10-CM

## 2018-06-22 DIAGNOSIS — F419 Anxiety disorder, unspecified: ICD-10-CM

## 2018-06-22 DIAGNOSIS — Z9889 Other specified postprocedural states: Principal | ICD-10-CM

## 2018-06-22 DIAGNOSIS — H547 Unspecified visual loss: Principal | ICD-10-CM

## 2018-06-22 DIAGNOSIS — F329 Major depressive disorder, single episode, unspecified: ICD-10-CM

## 2018-06-22 DIAGNOSIS — M1220 Villonodular synovitis (pigmented), unspecified site: ICD-10-CM

## 2018-06-22 DIAGNOSIS — E039 Hypothyroidism, unspecified: ICD-10-CM

## 2018-06-25 ENCOUNTER — Encounter: Admit: 2018-06-25 | Discharge: 2018-06-25

## 2018-06-27 ENCOUNTER — Encounter: Admit: 2018-06-27 | Discharge: 2018-06-27

## 2018-07-04 ENCOUNTER — Encounter: Admit: 2018-07-04 | Discharge: 2018-07-04

## 2018-07-09 ENCOUNTER — Encounter: Admit: 2018-07-09 | Discharge: 2018-07-09

## 2018-07-10 ENCOUNTER — Encounter: Admit: 2018-07-10 | Discharge: 2018-07-10

## 2018-07-18 NOTE — Telephone Encounter
spoke with patient, some insurance issue, she will call us back to schedule when problem resolved.

## 2018-07-18 NOTE — Progress Notes
affected area every 24 hours. Apply patch for 12 hours, then remove for 12 hours before repeating.   ??? ondansetron (ZOFRAN) 4 mg tablet Take one tablet by mouth every 8 hours as needed for Nausea or Vomiting.     Vitals:    06/22/18 1153   Weight: 78.9 kg (174 lb)   Height: 170.2 cm (67)   PainSc: Ten     Body mass index is 27.25 kg/m???.     Physical Exam  Ortho Exam  Right knee: No exam noted today.  Unable to do exam.       Assessment and Plan:  Assessment: 6 weeks status post right knee arthroscopy partial medial meniscectomy.    Plan: Recommend continue strengthening exercises for the knee along with ice and nonsteroidal anti-inflammatory medications as needed.  Patient will see me back in 6 to 8 weeks or as needed.  Spent approximately 10 minutes in consultation with patient via telehealth.

## 2018-08-06 ENCOUNTER — Encounter: Admit: 2018-08-06 | Discharge: 2018-08-06

## 2018-08-06 NOTE — Telephone Encounter
Faxed paperwork to [employer] for Christine Cowan for clinical information.

## 2019-06-13 IMAGING — DX XR chest 1V portable
1 series · 1 of 1 positions shown · non-contrast
Comparison: none

Procedure(s): XR chest 1V portable

CHEST X-RAY, single view
CLINICAL DATA: Upper respiratory infection.

[AP]
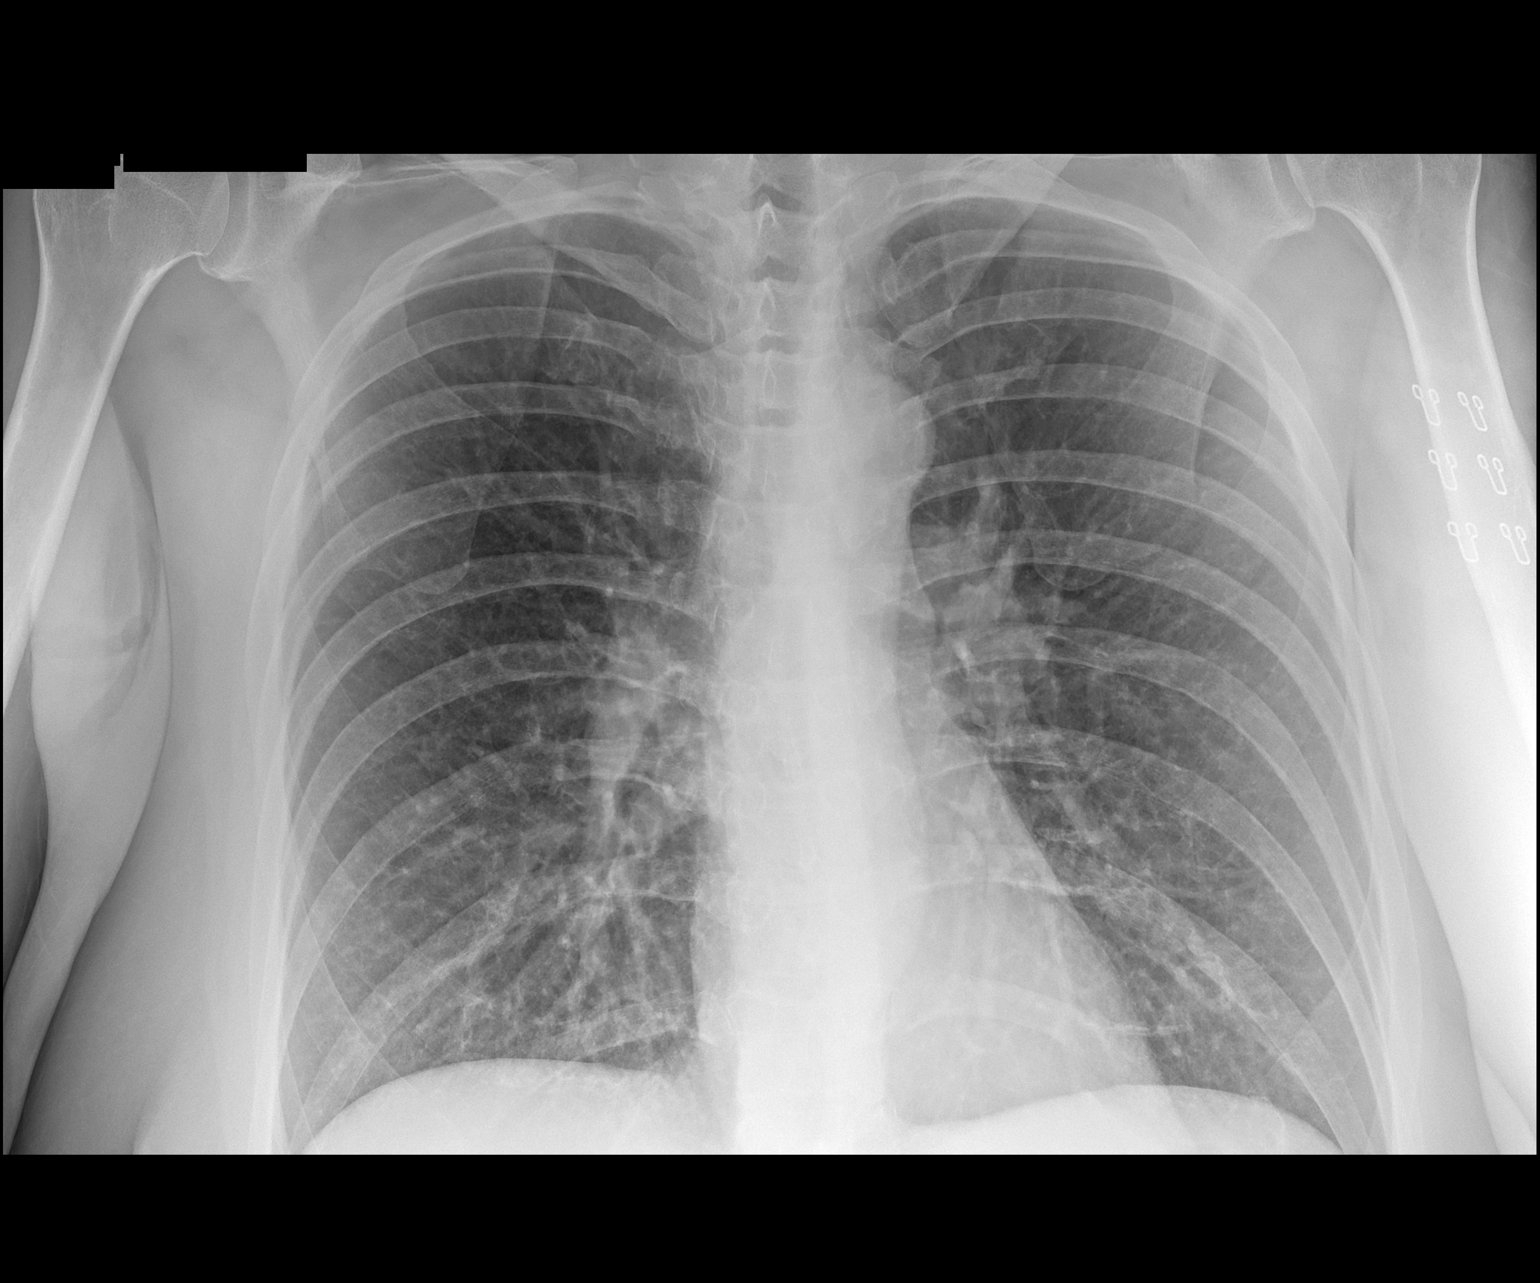

[1 of 1 positions shown; findings below may reference images not displayed]

FINDINGS: The heart is normal sized. The mediastinum is unremarkable.
The hila show no obvious abnormalities. No pneumothorax is
seen. No consolidations or pleural effusions are detected.
IMPRESSION: No acute cardiopulmonary process detected.

## 2020-07-09 IMAGING — CT CHESTW
2 of 3 series · 15 of 36 positions shown, 18 images · IV contrast (OptiRay 320)
Comparison: Chest x-ray 06/13/2019

Procedure(s): CT chest w con

EXAMINATION:
CT chest with contrast
INDICATION: Chest pain
PROCEDURE:
Routine CT scan of the chest was performed following the
intravenous administration of 100 cc Optiray 320. Automated
exposure control (AEC) dose reduction technique was used.

[Series 2: aice body aice 3.0 ce · axial · 0.78mm/px · z∈[+1418,+1718]mm · 12 of 118 slices shown, 15 images]
[im 9/118  mediastinal]
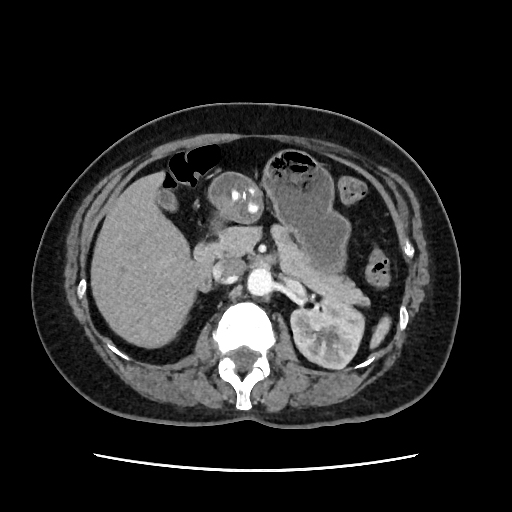
[im 9/118  lung]
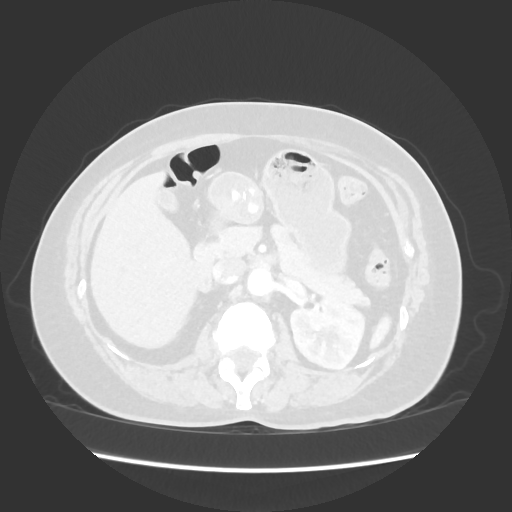
[im 18/118  lung]
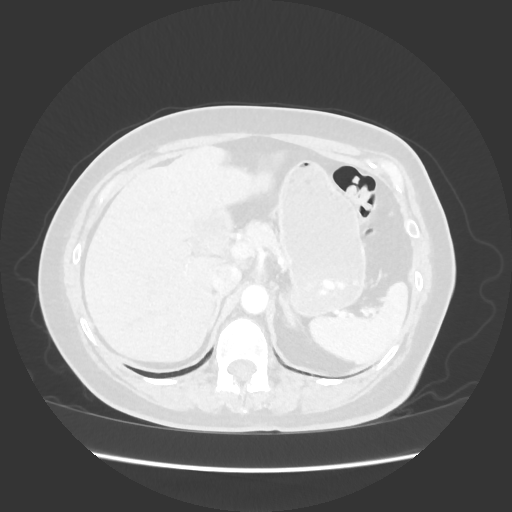
[im 27/118  lung]
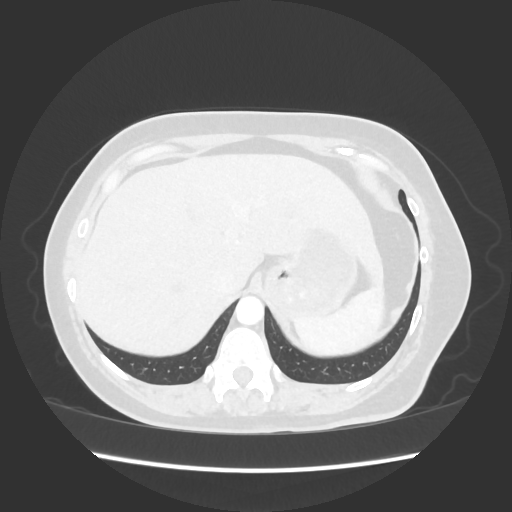
[im 35/118  lung]
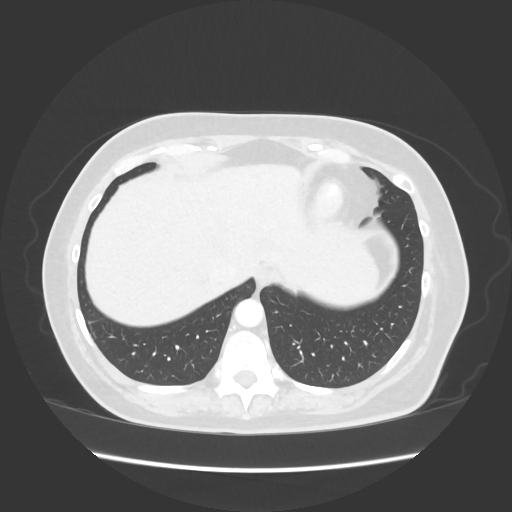
[im 44/118  mediastinal]
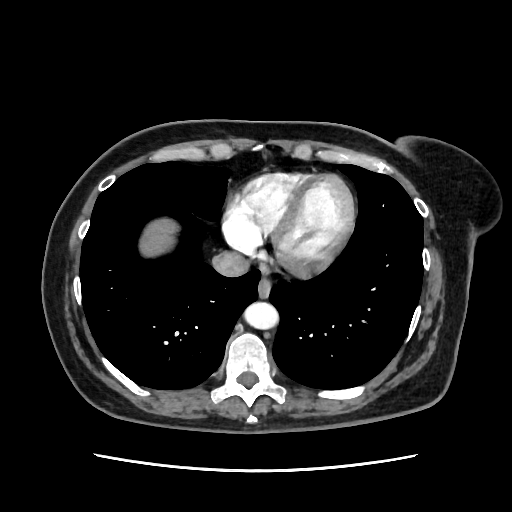
[im 44/118  lung]
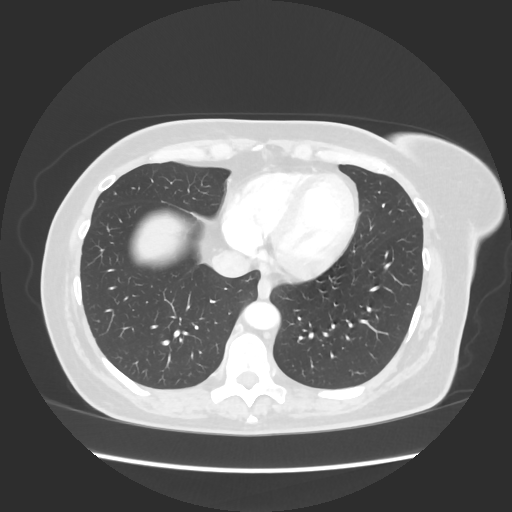
[im 53/118  lung]
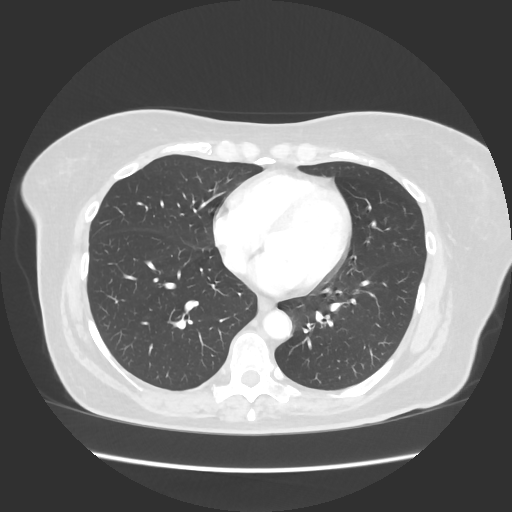
[im 66/118  lung]
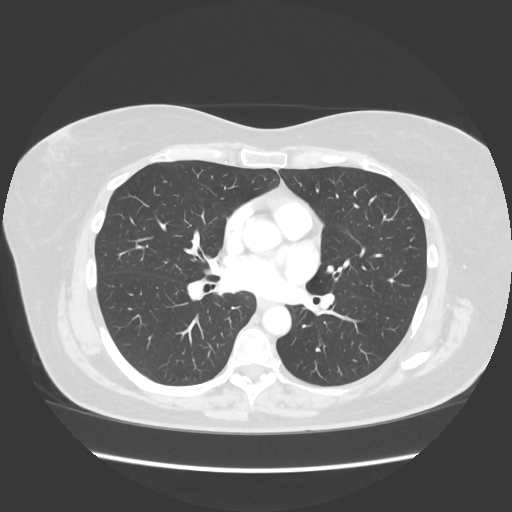
[im 74/118  lung]
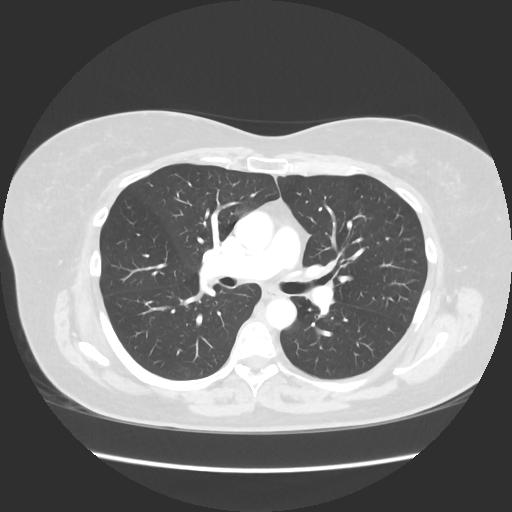
[im 83/118  mediastinal]
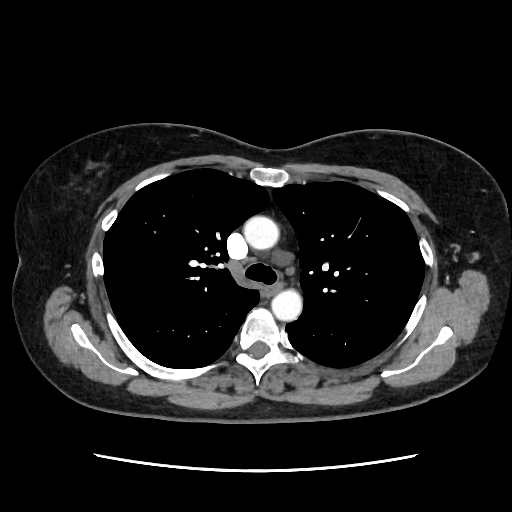
[im 83/118  lung]
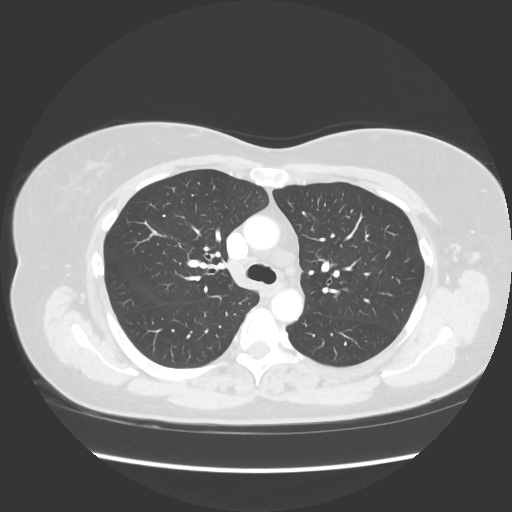
[im 92/118  lung]
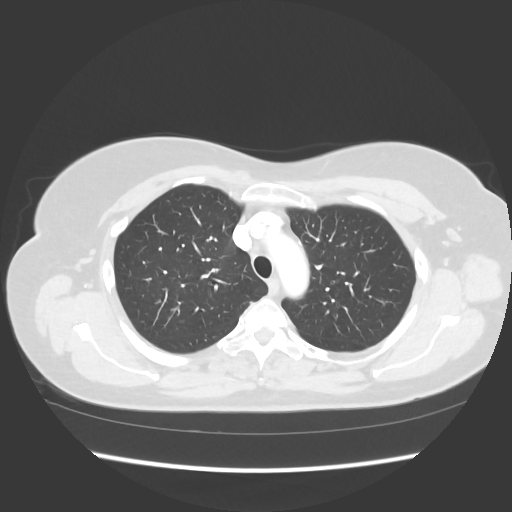
[im 100/118  lung]
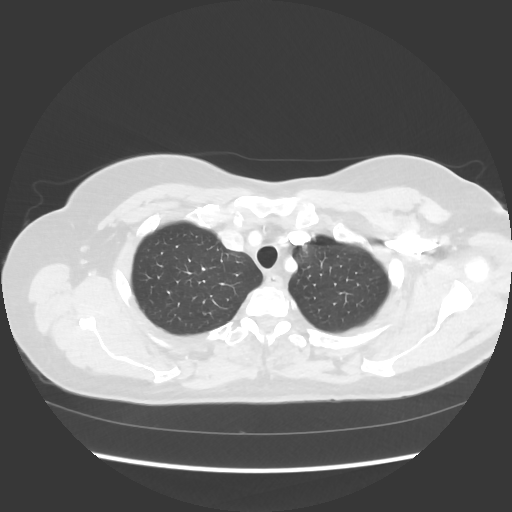
[im 109/118  lung]
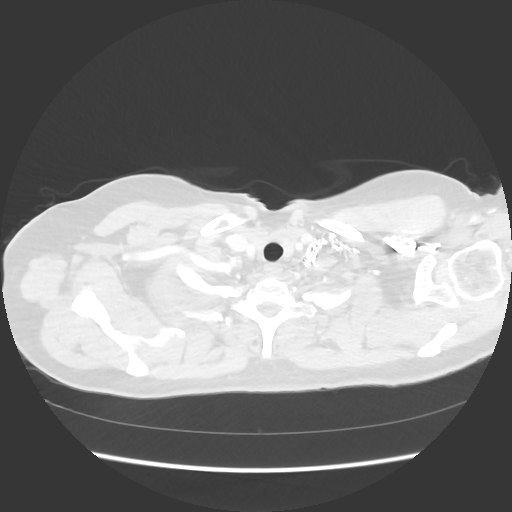

[Series 5: aice 3.000 ce · coronal · 0.78mm/px · 3 of 78 slices shown]
[im 16/78  lung]
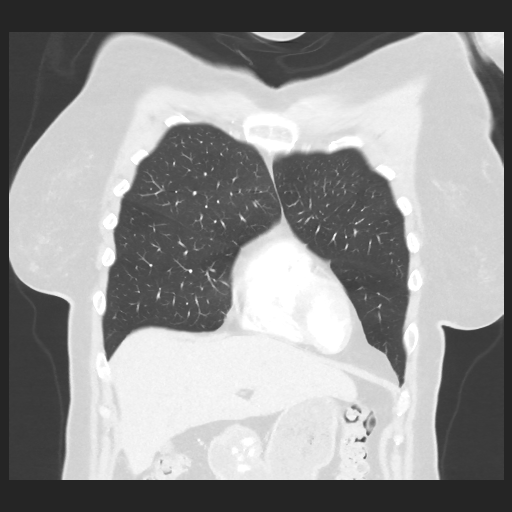
[im 31/78  lung]
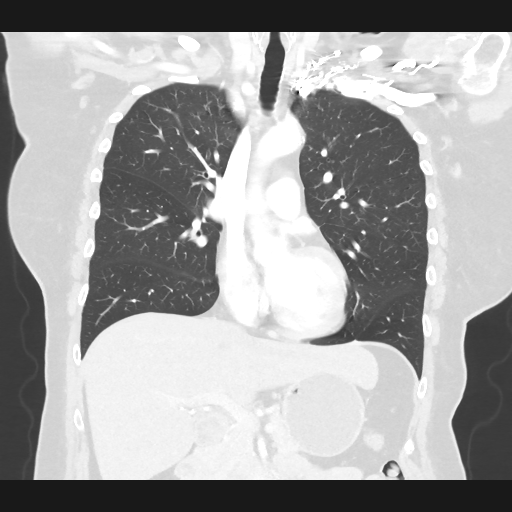
[im 47/78  lung]
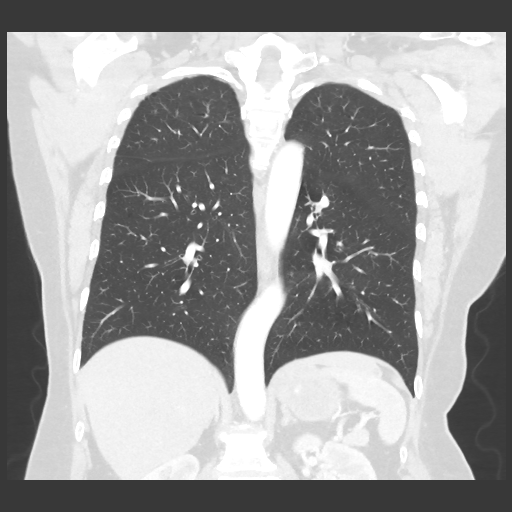

[15 of 36 positions shown; findings below may reference images not displayed]

FINDINGS: There is no axillary adenopathy. There are several mildly
enlarged mediastinal lymph nodes measuring up to
approximately 9 mm in short axis. The aorta is normal. The
lungs are well expanded and are free of focal infiltrate.
No suspicious pulmonary nodules are identified. No pleural
or pericardial effusion is present.  Images of the upper
abdomen are unremarkable.
IMPRESSION: 1.  No acute abnormality is identified in the chest.
2.  Mildly prominent mediastinal lymph nodes, probably
reactive.

## 2020-07-22 ENCOUNTER — Encounter: Admit: 2020-07-22 | Discharge: 2020-07-22

## 2020-07-22 NOTE — Telephone Encounter
Returning VM left by patient.  Patient states she would like to pursue seeing a rheumatologist but is unsure if a rheumatologist will see her without documentation. Patient questioned if Dr. Collier Bullock would have this documentation.  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  Encouraged her to contact her PCP regarding referral.

## 2020-07-23 ENCOUNTER — Encounter: Admit: 2020-07-23 | Discharge: 2020-07-23

## 2021-07-30 IMAGING — MR MR hip LT wo con
4 of 7 series · 27 of 48 positions shown · non-contrast
Comparison: None.

Procedure(s): MR hip LT wo con

EXAMINATION: LEFT HIP MAGNETIC RESONANCE IMAGING WITHOUT CONTRAST.
HISTORY: Left hip pain for 1 year.
TECHNIQUE: Multiplanar, multi-sequential MR images of the left hip were obtained
without intravenous or intra-articular contrast.

[Series 2: T1 · coronal · 4.0mm · 0.70mm/px · 6 of 35 slices shown]
[im 1/35]
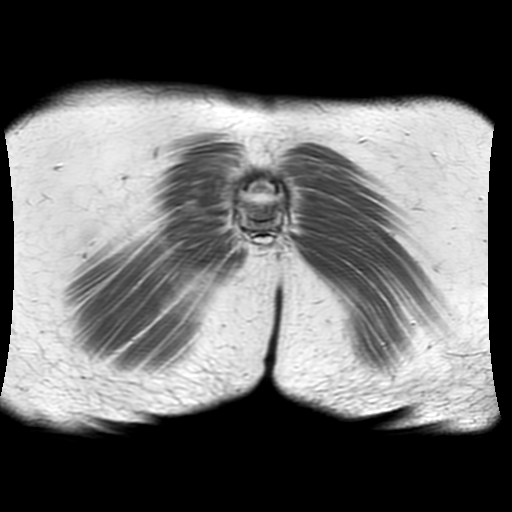
[im 6/35]
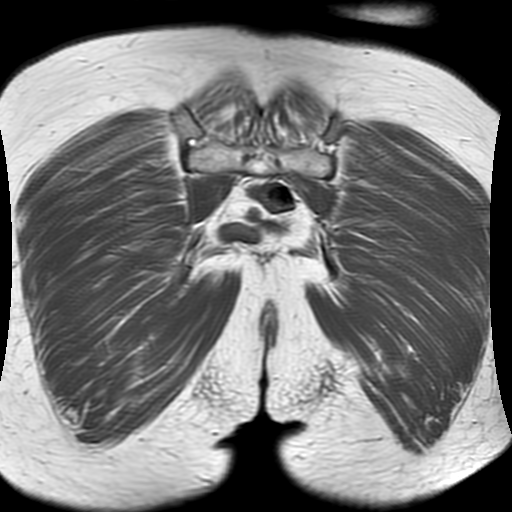
[im 12/35]
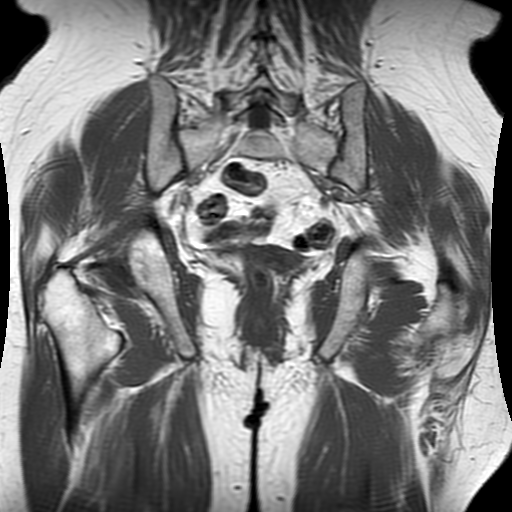
[im 18/35]
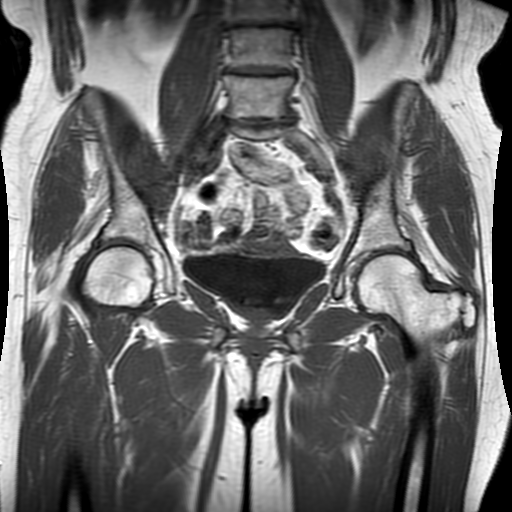
[im 23/35]
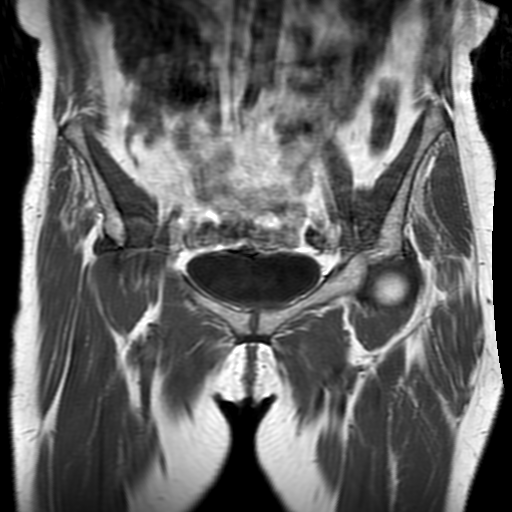
[im 29/35]
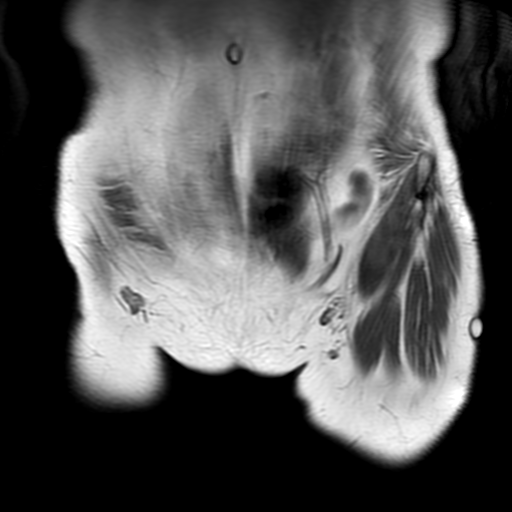

[Series 3: PD · coronal · 4.0mm · 0.70mm/px · 7 of 34 slices shown]
[im 1/34]
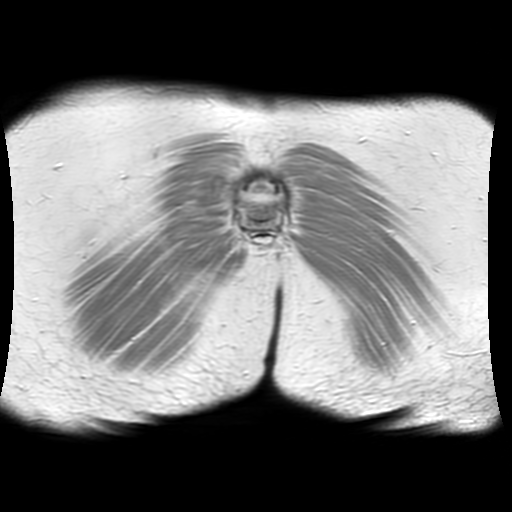
[im 6/34]
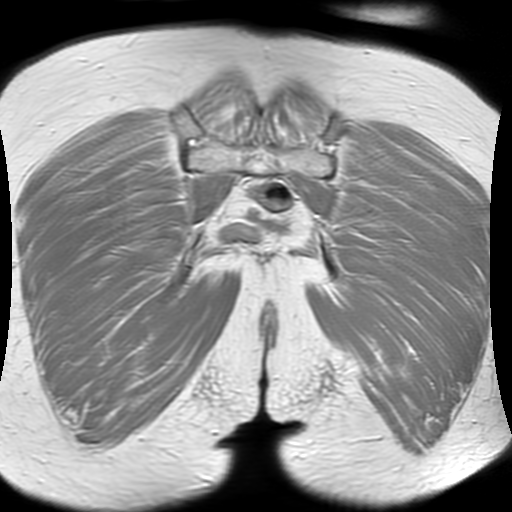
[im 12/34]
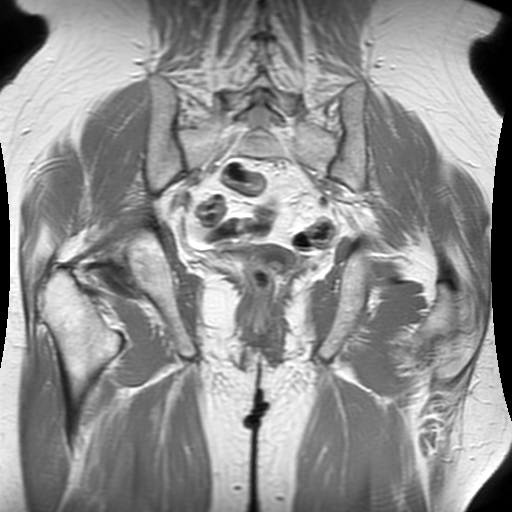
[im 17/34]
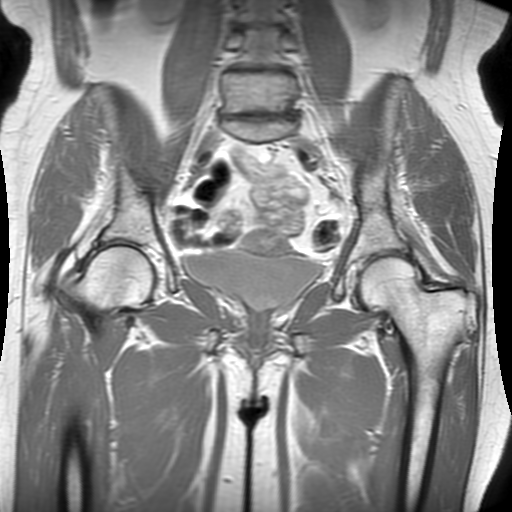
[im 23/34]
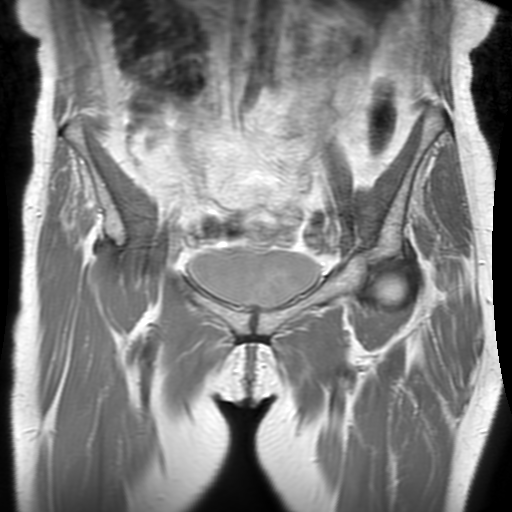
[im 28/34]
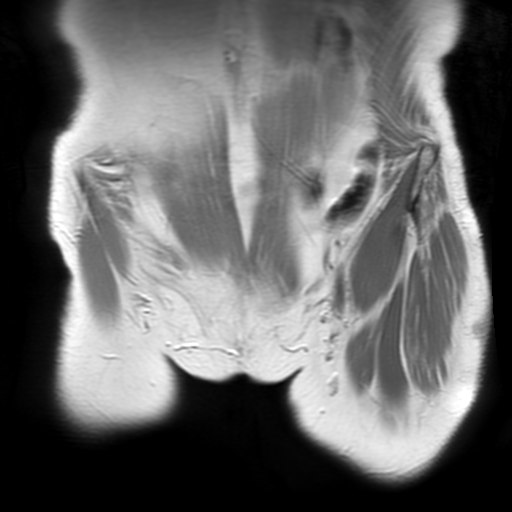
[im 34/34]
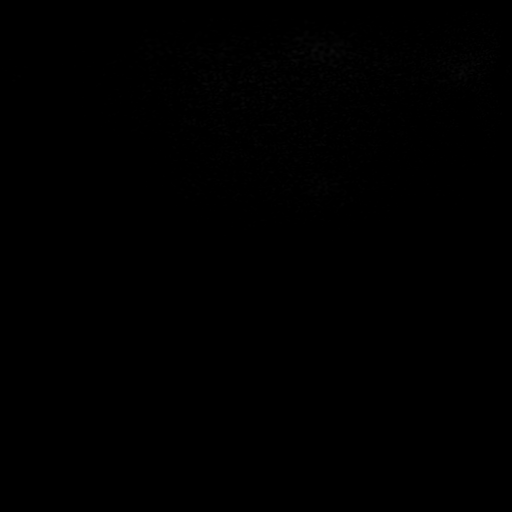

[Series 4: T2 fat-sat · coronal · 4.0mm · 1.41mm/px · 7 of 35 slices shown (1 of 2)]
[im 1/35]
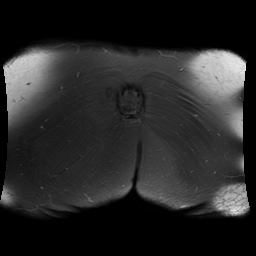
[im 6/35]
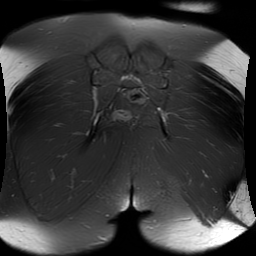
[im 12/35]
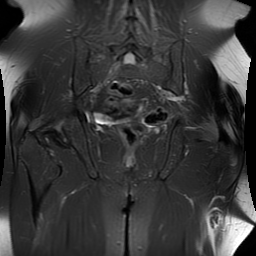
[im 18/35]
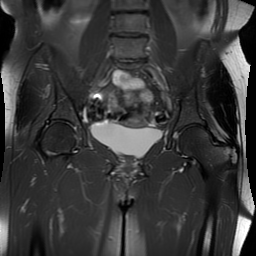
[im 23/35]
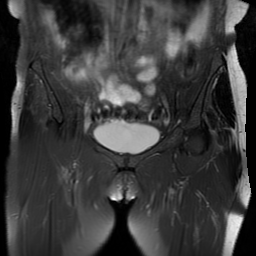
[im 29/35]
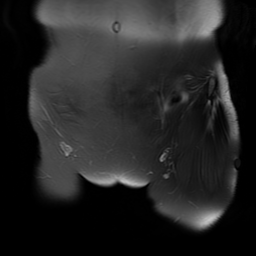
[im 35/35]
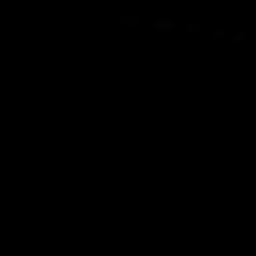

[Series 7: T2 fat-sat · coronal · 4.0mm · 1.60mm/px · 7 of 35 slices shown (2 of 2)]
[im 1/35]
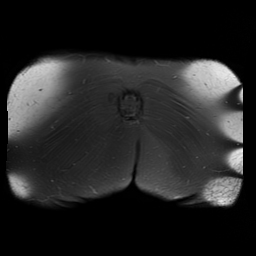
[im 6/35]
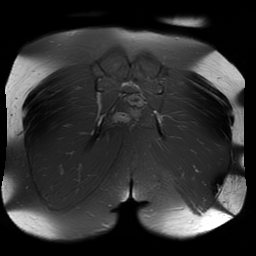
[im 12/35]
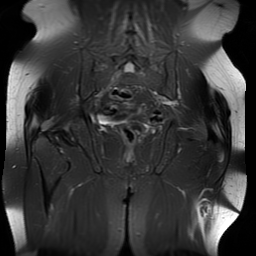
[im 18/35]
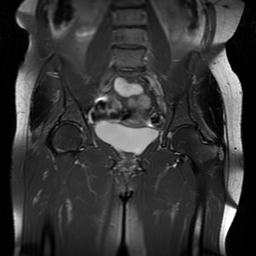
[im 23/35]
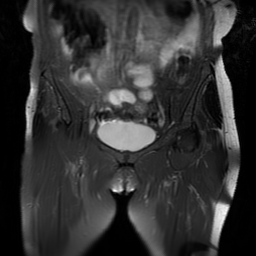
[im 29/35]
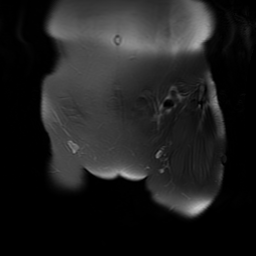
[im 35/35]
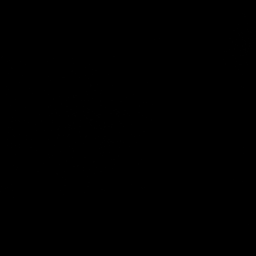

[27 of 48 positions shown; findings below may reference images not displayed]

FINDINGS: There is severe L5-S1 degenerative disc disease. The sacroiliac joints are
congruent. There is no sacral stress reaction or insufficiency fracture.

The hamstrings origins are intact bilaterally. Both sciatic nerves are normal.
The iliopsoas muscles and tendons are intact bilaterally.

There is no stress reaction or stress fracture of the hips. There is no
avascular necrosis of the hips. There are no hip effusions. The superior and
inferior pubic rami are intact. The pubic symphysis is congruent. The rectus
abdominous-adductor aponeurosis is intact.

The uterus and adnexa are normal. There is no free fluid within the pelvis.
There is no pelvic or inguinal lymphadenopathy. There is no bladder wall
thickening.

There is chronic high-grade partial-thickness tearing of the right gluteus
minimus tendon at its attachment to the anterior facet of the right greater
trochanter. There is associated advanced fatty atrophy of the right gluteus
minimus muscle. The right gluteus medius tendon is intact. There is no fluid
distending the right greater trochanteric bursa.

There is full-thickness or near full-thickness tearing of the left gluteus
minimus tendon at its attachment to the anterior facet of the left greater
trochanter. This tearing is most likely chronic given the moderate fatty atrophy
of the left gluteus minimus muscle. There is mild left gluteus medius tendinosis
without tendon tearing at its attachment to the lateral and superoposterior
facets of the left greater trochanter. There is a very small amount of fluid
distending the left greater trochanteric bursa.

There is no acetabular retroversion. Femoral head-neck morphology is normal.
There are no hip effusions. The ligamentum teres and the transverse ligament are
intact. The iliofemoral ligament is intact.
IMPRESSION: 1. Chronic full-thickness or near full-thickness tearing of the left gluteus
minimus tendon at its attachment to the anterior facet of the left greater
trochanter. There is associated moderate fatty atrophy of the left gluteus
minimus muscle. There is minimal left greater trochanteric bursitis.

2. Mild left gluteus medius tendinosis without tendon tearing.

3. Chronic high-grade partial-thickness tearing of the right gluteus minimus
tendon at its attachment to the anterior facet of the right greater trochanter.
There is associated advanced fatty atrophy of the right gluteus minimus muscle.
No right greater trochanteric bursitis.

4. No stress reaction or stress fracture of the hips. No avascular necrosis of
the hips. No sacral insufficiency fracture.

5. Severe L5-S1 degenerative disc disease.

## 2021-10-07 IMAGING — US US venous dop LE [ID]
1 series · 14 of 16 positions shown · non-contrast
Comparison: None.

Procedure(s): US venous dop LE [id number]

DUPLEX SCAN, LEFT LOWER EXTREMITY VEINS
INDICATION: Left lower extremity pain and swelling and
palpable abnormality on
the thigh
TECHNIQUE: Duplex sonography to evaluate the lower
extremity veins was
performed utilizing real time gray scale, doppler spectral
analysis, and color
flow imaging in addition to graded compression.

[Series 1: us venous dop le (id) · 14 of 26 slices shown]
[im 1/26]
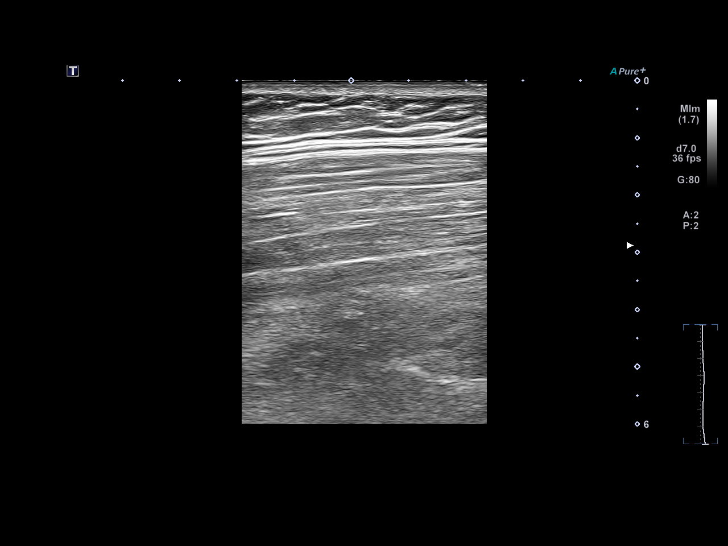
[im 2/26]
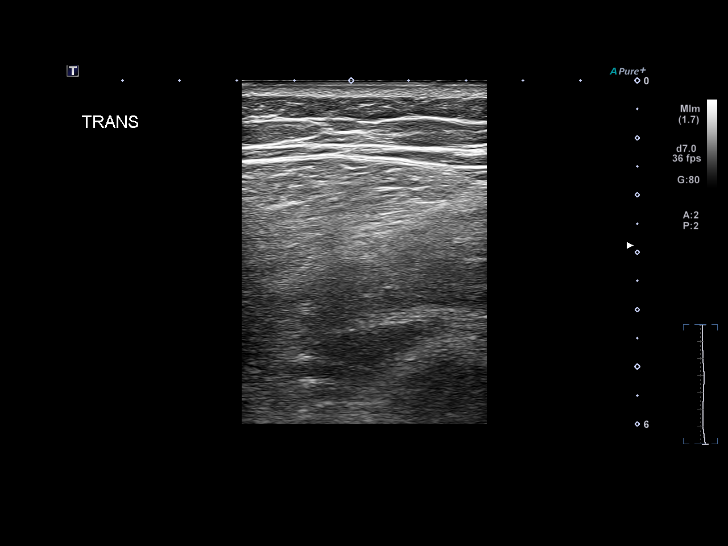
[im 4/26]
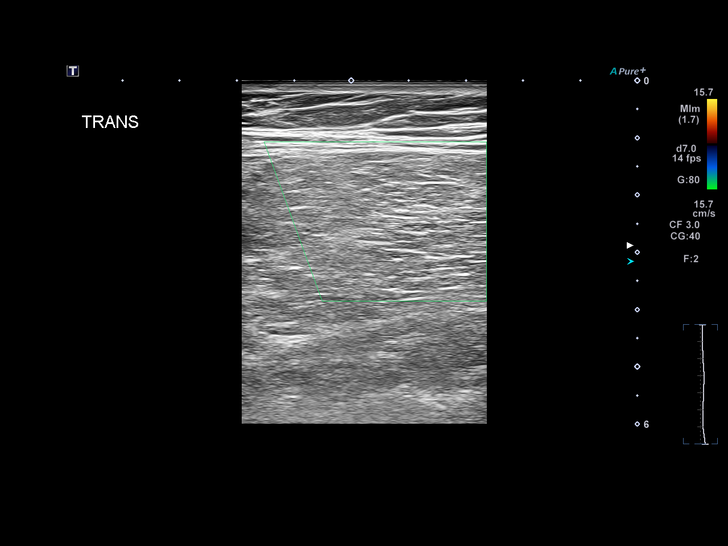
[im 7/26]
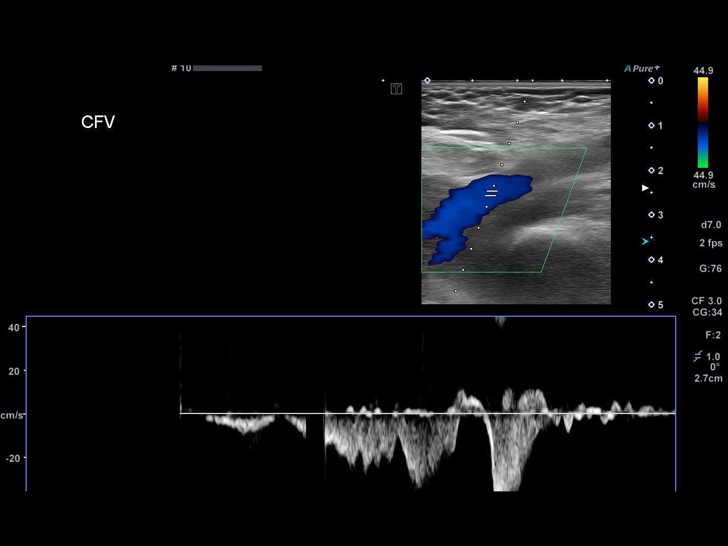
[im 9/26]
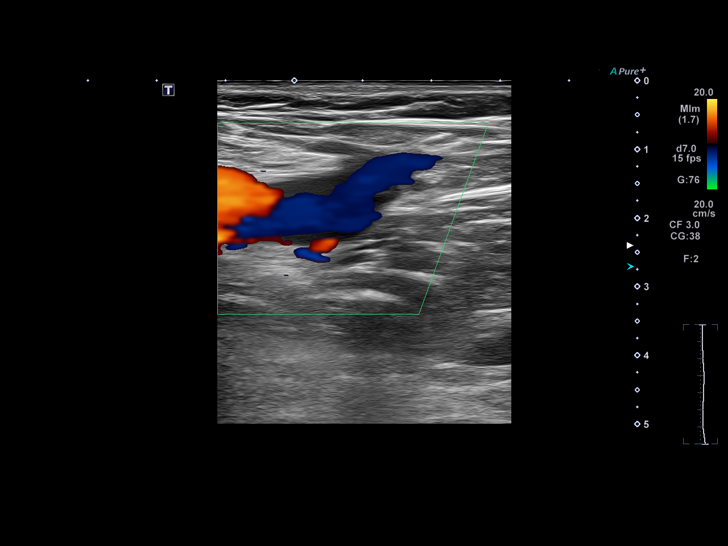
[im 11/26]
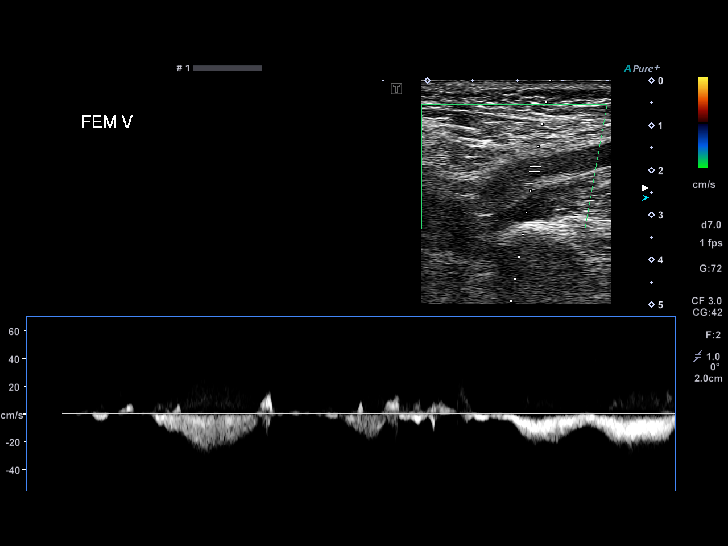
[im 12/26]
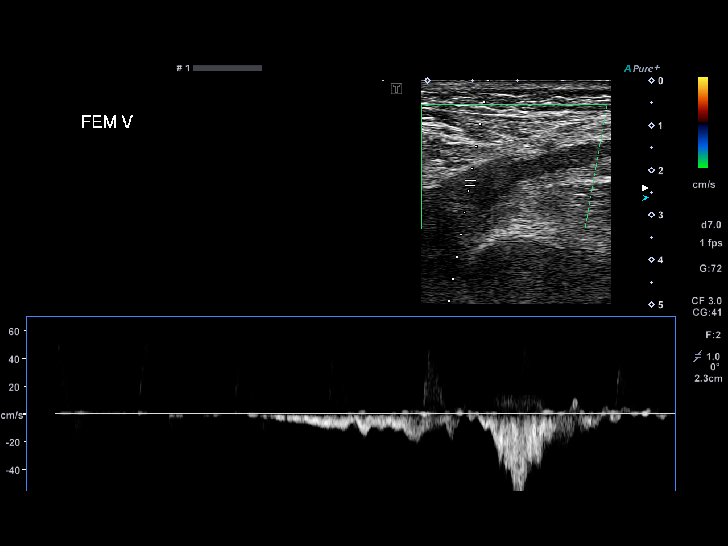
[im 14/26]
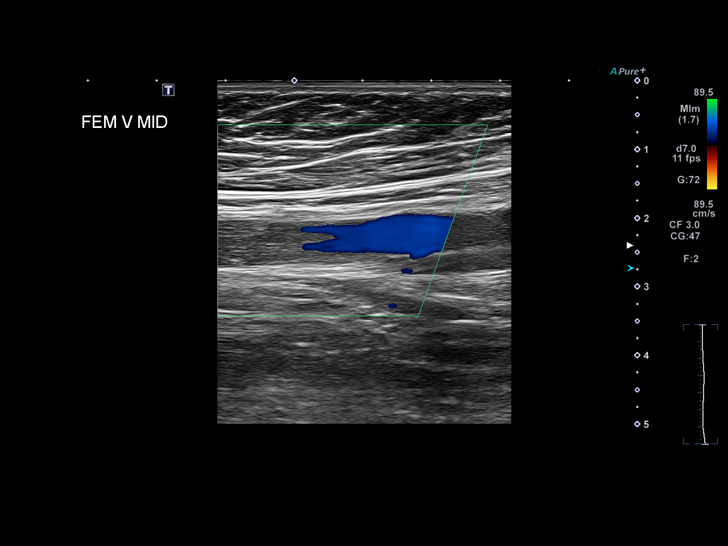
[im 16/26]
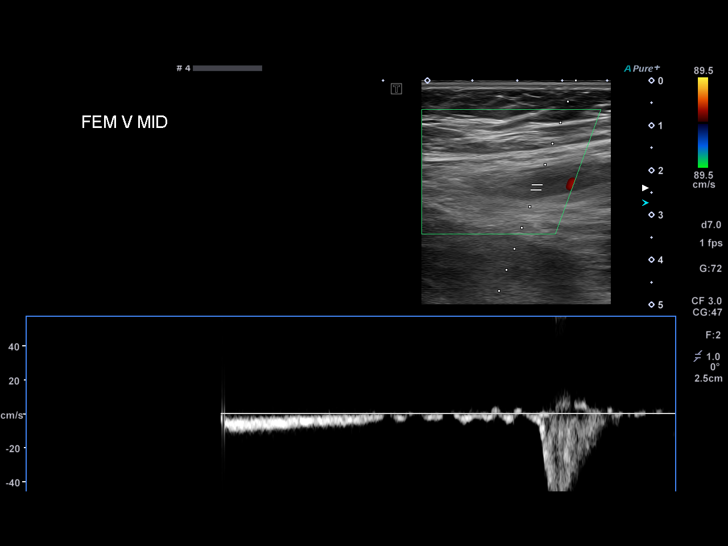
[im 17/26]
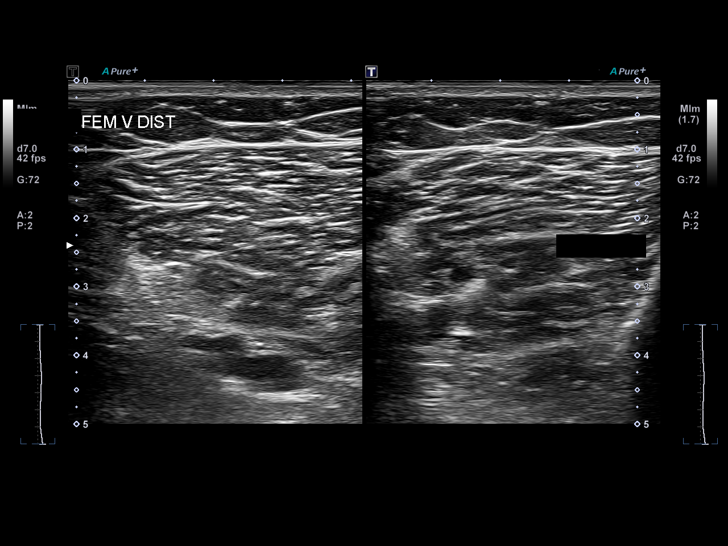
[im 21/26]
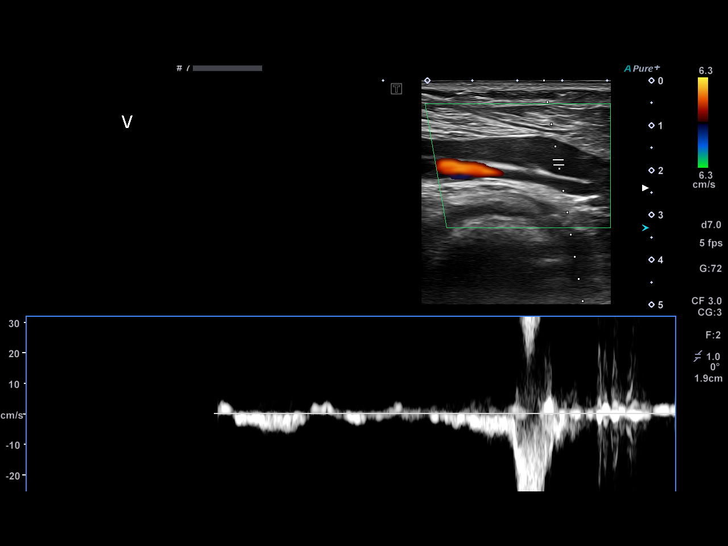
[im 22/26]
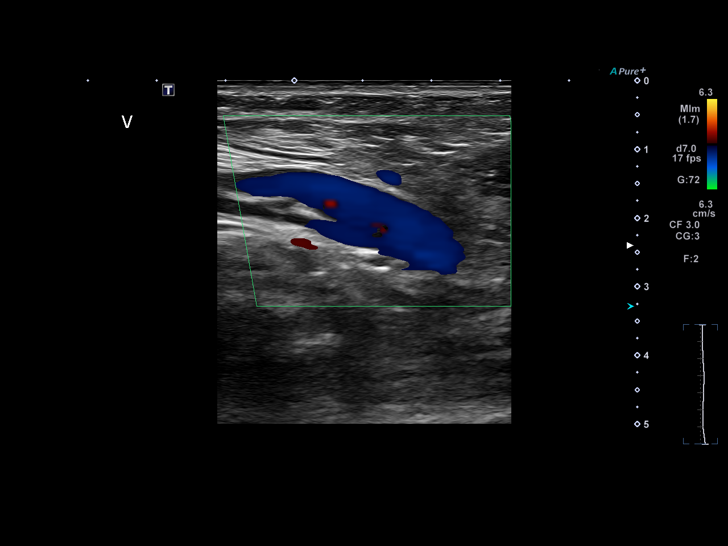
[im 24/26]
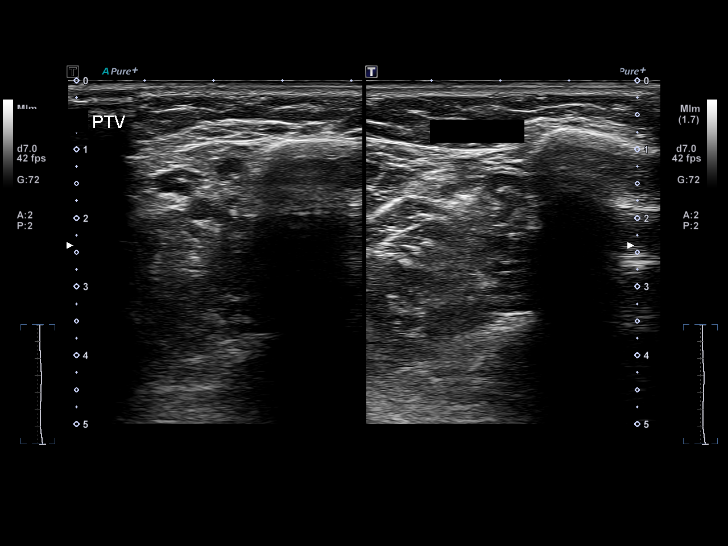
[im 26/26]
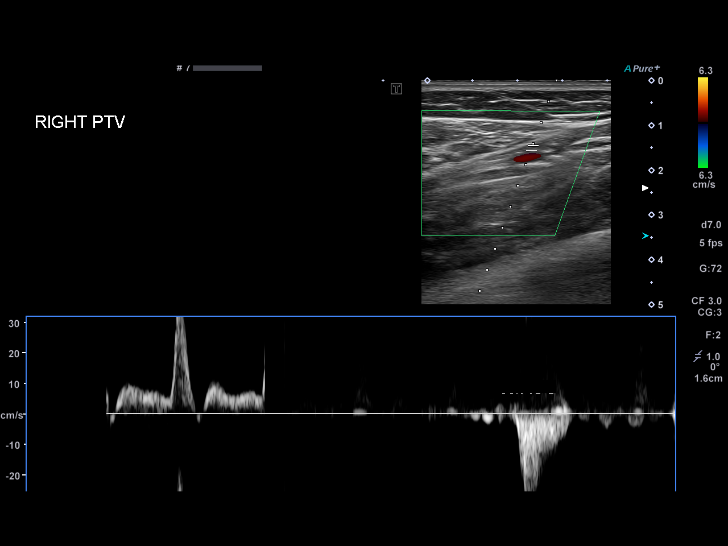

[14 of 16 positions shown; findings below may reference images not displayed]

FINDINGS: There is normal flow, respiratory variation, compression,
and response to
augmentation in the deep femoral venous system. There is no
evidence of deep
venous thrombosis. At site of palpable abnormality in the
thigh no focal soft
tissue abnormality is appreciated.
IMPRESSION: 1.  No evidence of left lower extremity DVT.

## 2021-10-07 IMAGING — DX Shoulder
3 series · 3 of 3 positions shown · non-contrast
Comparison: None.

Procedure(s): XR shoulder LT min 2V

LEFT SHOULDER, 3 VIEWS
INDICATION: Left shoulder pain

[AP (1 of 3)]
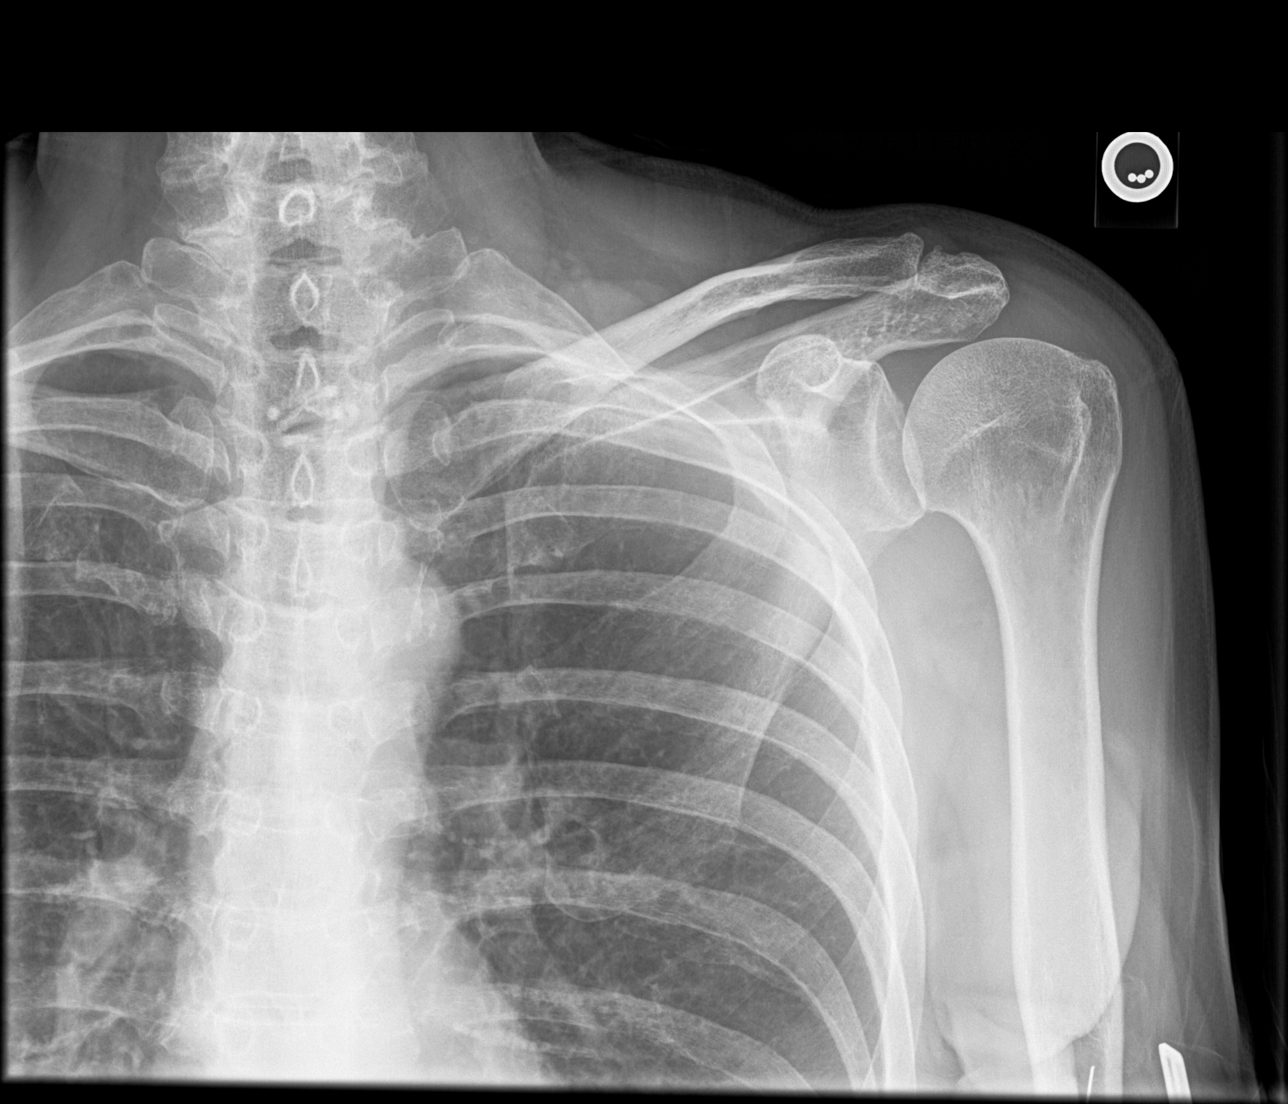

[AP (2 of 3)]
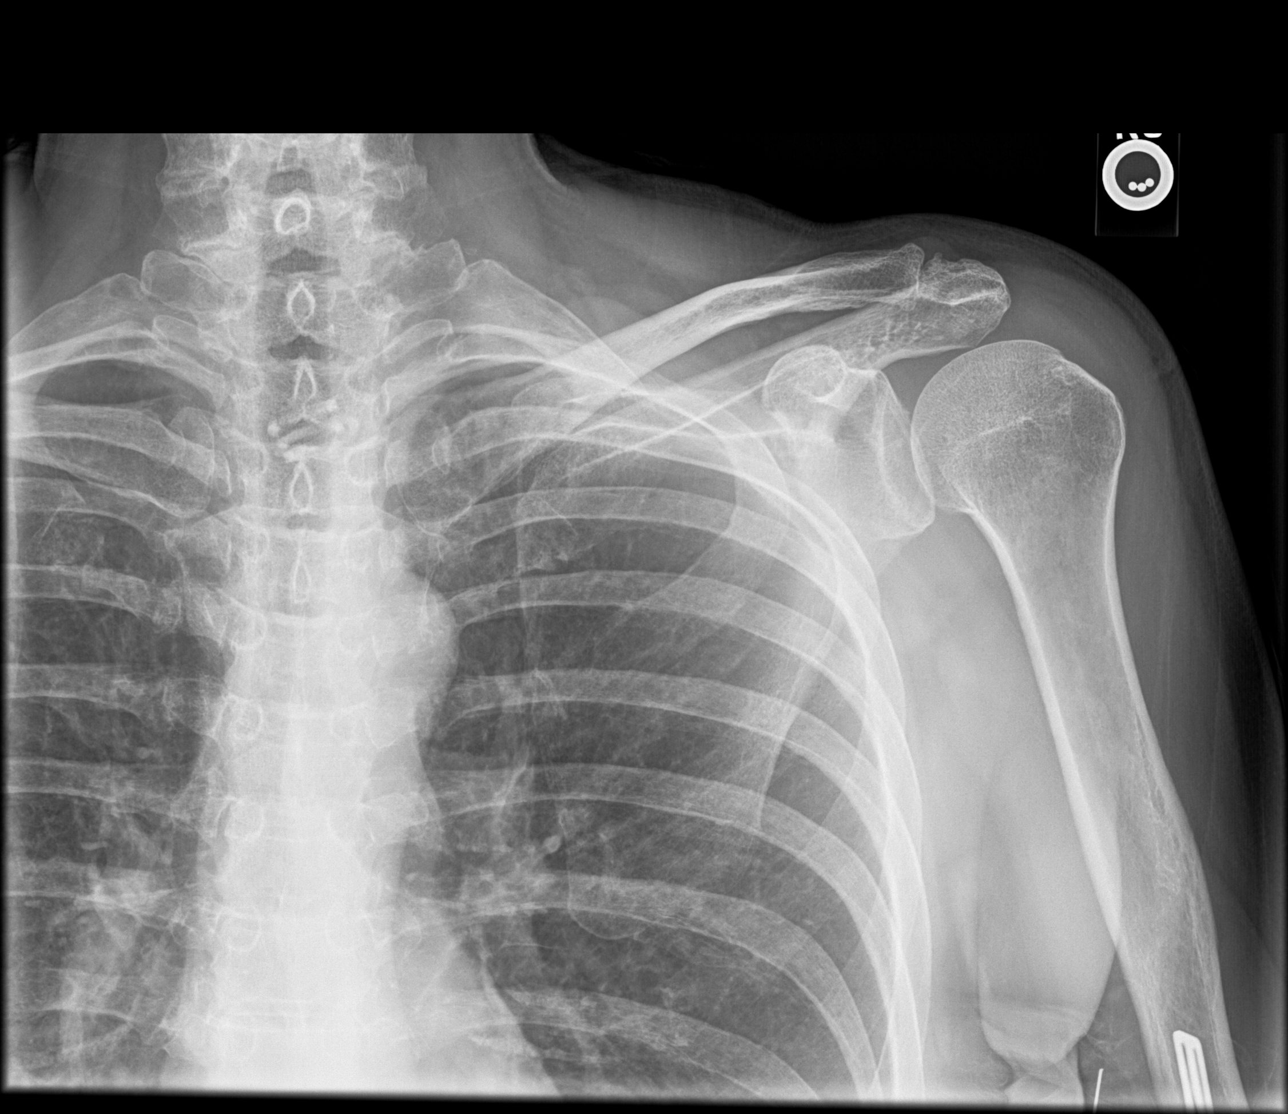

[AP (3 of 3)]
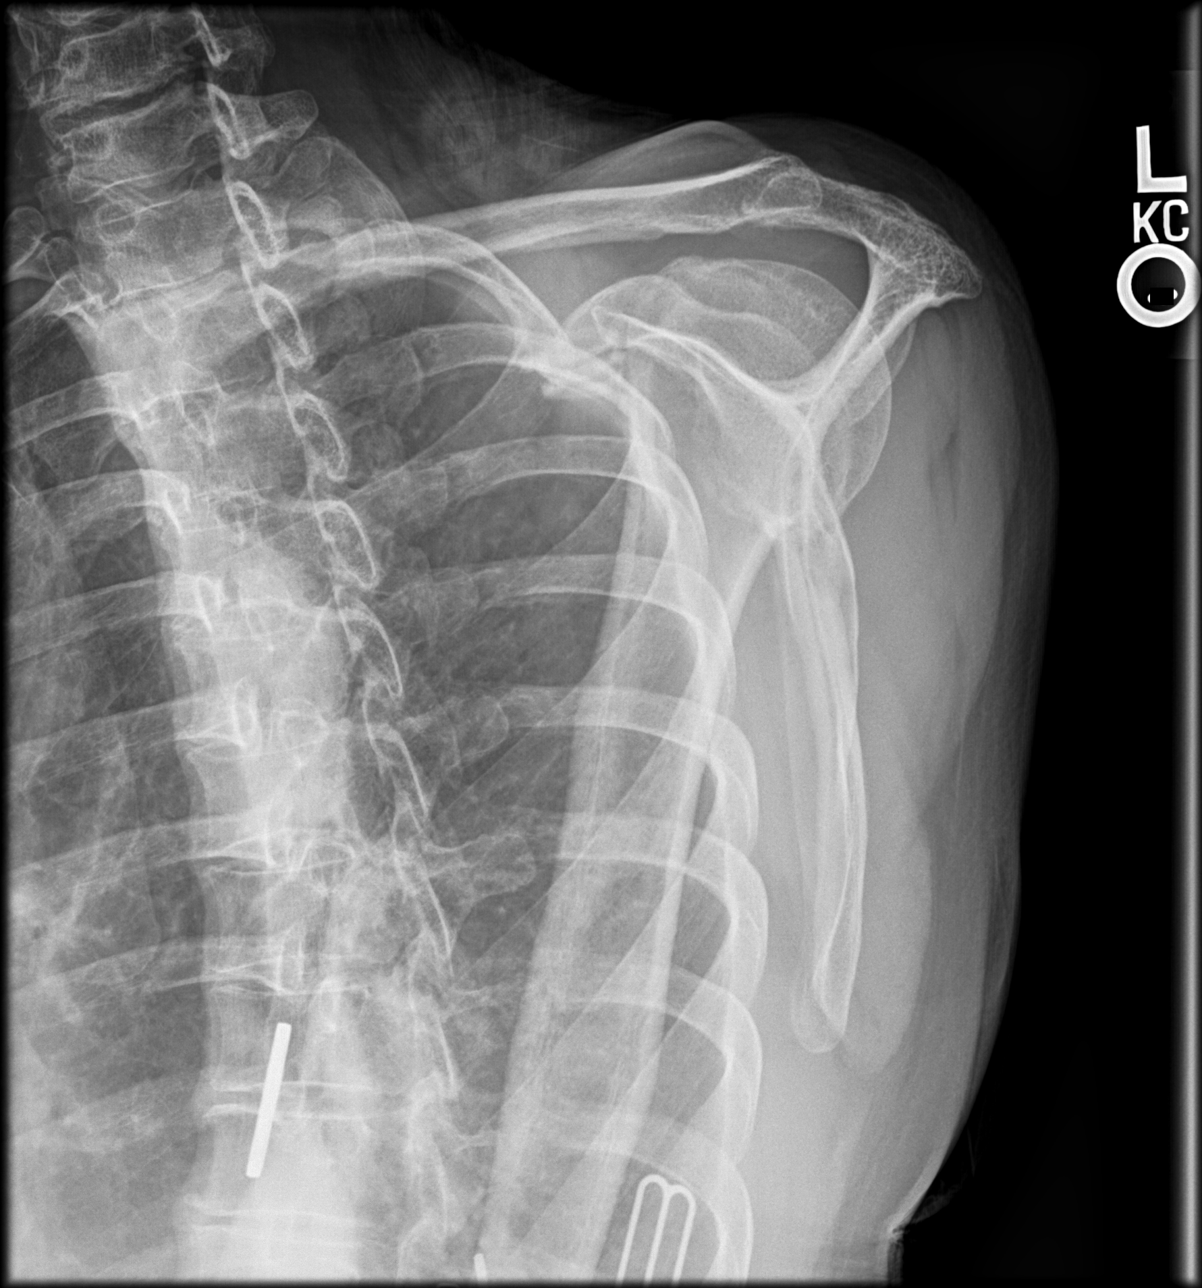

[3 of 3 positions shown; findings below may reference images not displayed]

FINDINGS: The humeral head appears centered in the glenoid. The
acromioclavicular joint
appears intact. There is no evidence of fracture or
subluxation.
IMPRESSION: No evidence of fracture or subluxation of the left shoulder.

XR-RADZREADING

## 2021-10-07 IMAGING — MR KNEE^RIGHT
5 of 7 series · 33 of 40 positions shown · non-contrast
Comparison: none

Procedure(s): MR knee RT wo con

EXAM:  MRI of the right knee.
INDICATIONS: Right knee pain and PVNS.
TECHNIQUE: Routine MRI of the right knee utilizing
standard image sequences.

[Series 3: PD fat-sat · axial · 4.0mm · 0.50mm/px · z∈[-51,+69]mm · 7 of 25 slices shown]
[im 1/25]
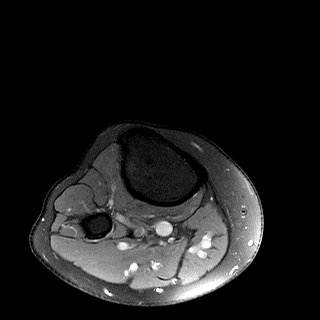
[im 5/25]
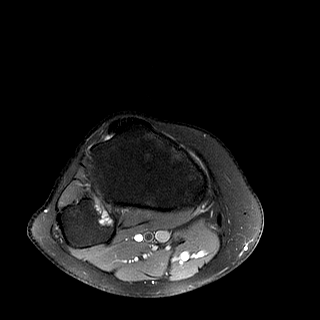
[im 9/25]
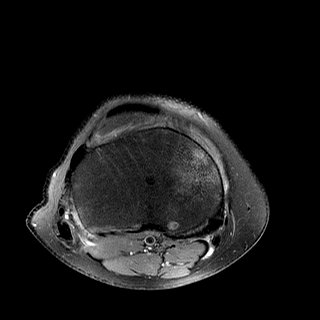
[im 13/25]
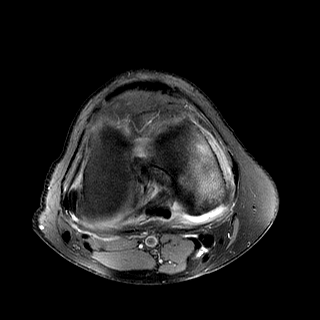
[im 17/25]
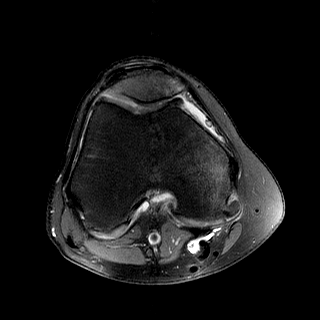
[im 21/25]
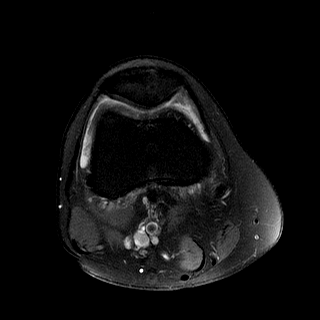
[im 25/25]
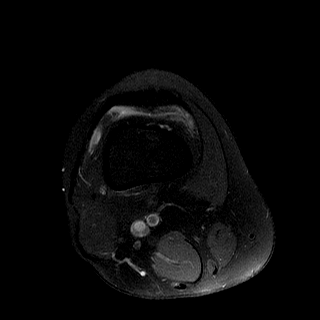

[Series 4: T1 · coronal · 4.0mm · 0.50mm/px · 7 of 25 slices shown]
[im 1/25]
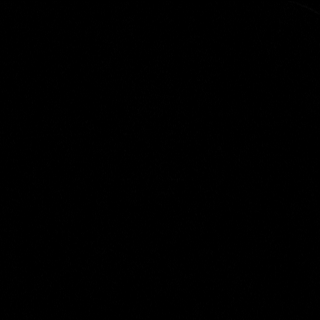
[im 5/25]
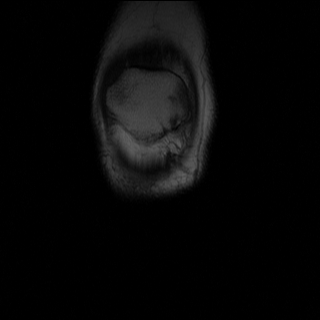
[im 9/25]
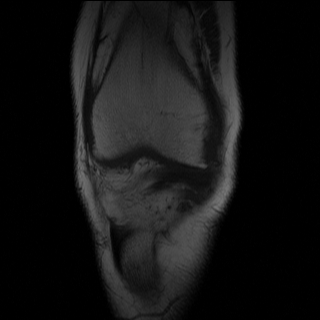
[im 13/25]
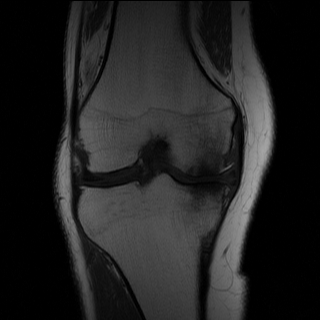
[im 17/25]
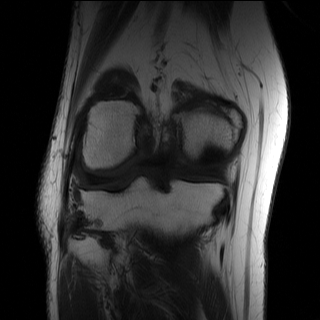
[im 21/25]
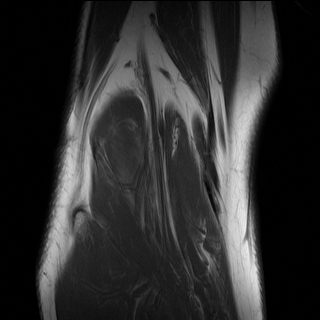
[im 25/25]
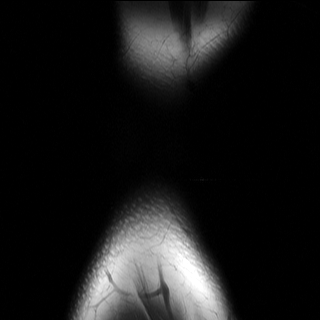

[Series 5: STIR · coronal · 4.0mm · 0.31mm/px · 5 of 25 slices shown]
[im 1/25]
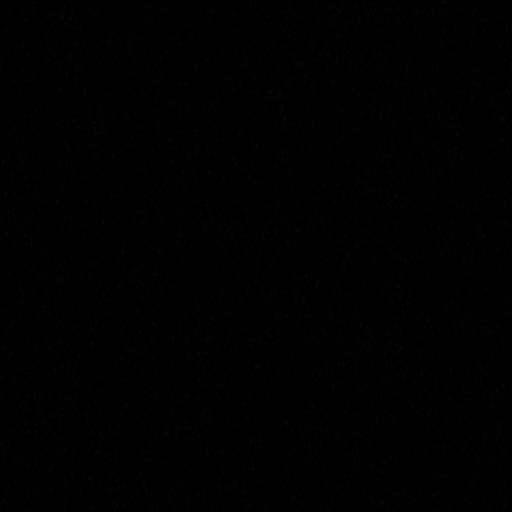
[im 5/25]
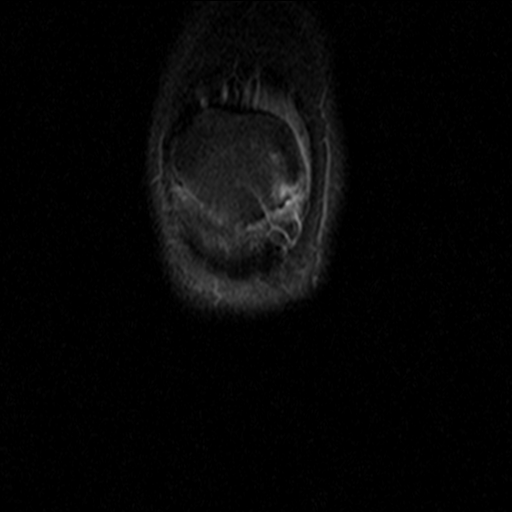
[im 9/25]
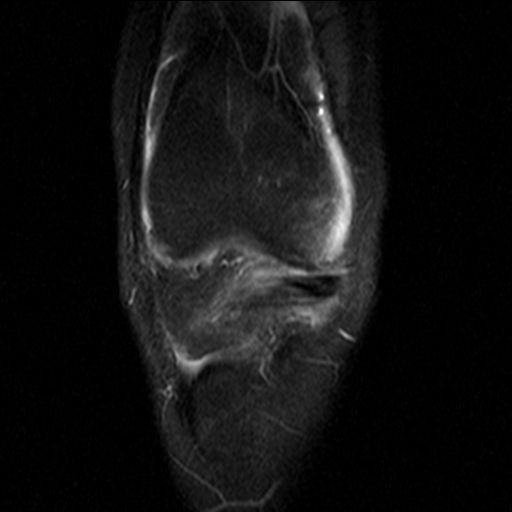
[im 13/25]
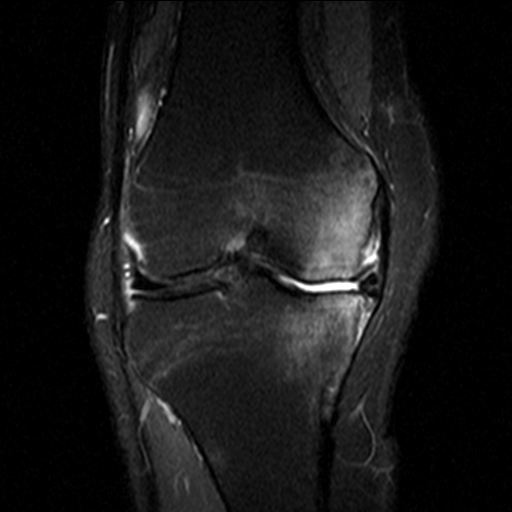
[im 17/25]
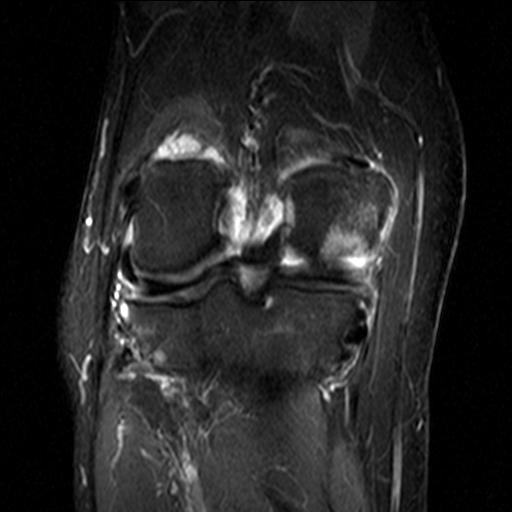

[Series 6: PD · sagittal · 4.0mm · 0.42mm/px · 7 of 25 slices shown]
[im 1/25]
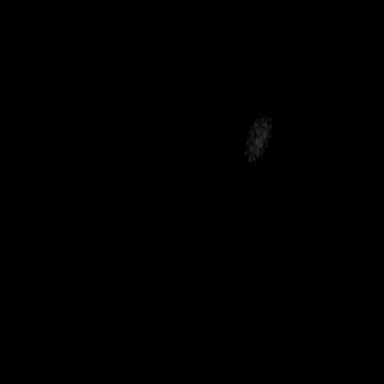
[im 5/25]
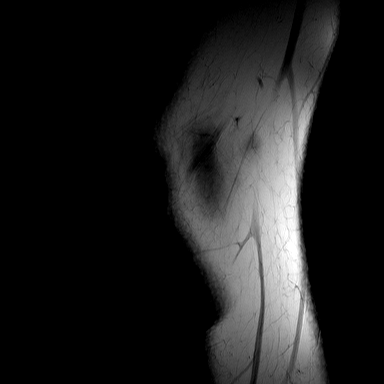
[im 9/25]
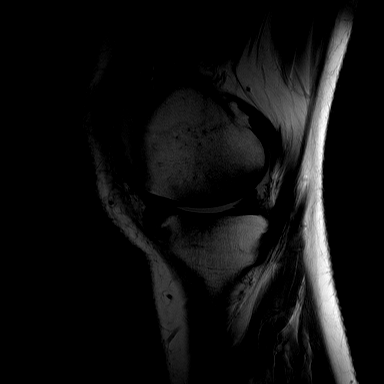
[im 13/25]
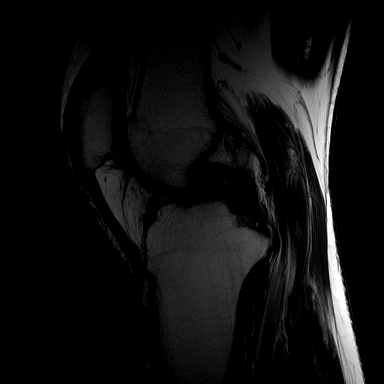
[im 17/25]
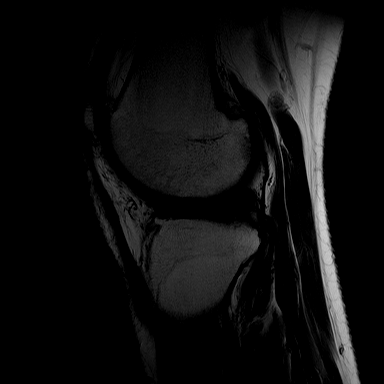
[im 21/25]
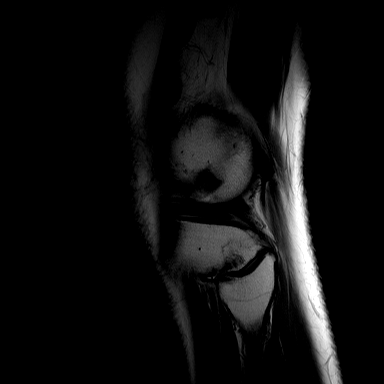
[im 25/25]
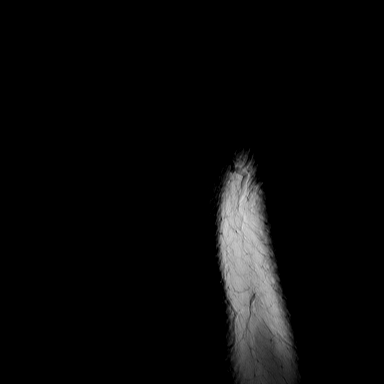

[Series 7: T2 fat-sat · sagittal · 4.0mm · 0.42mm/px · 7 of 25 slices shown]
[im 1/25]
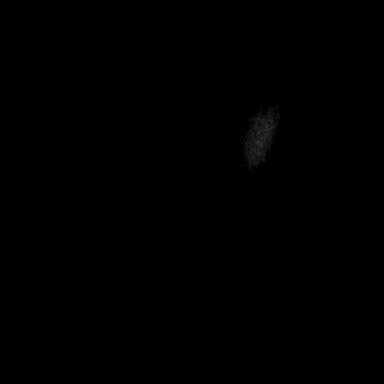
[im 5/25]
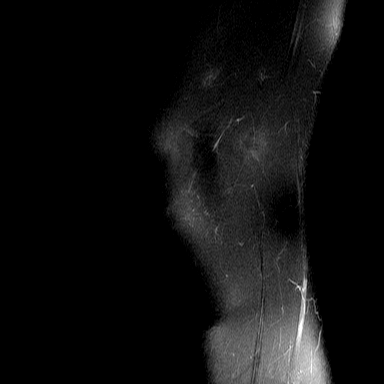
[im 9/25]
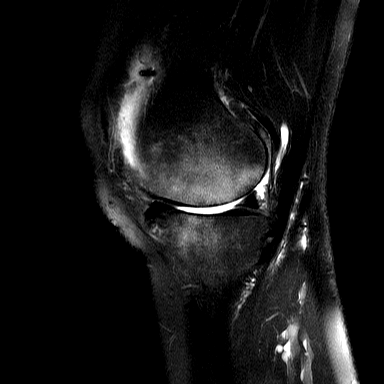
[im 13/25]
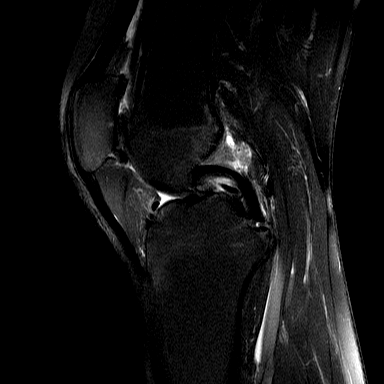
[im 17/25]
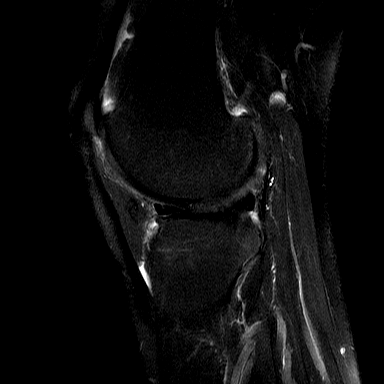
[im 21/25]
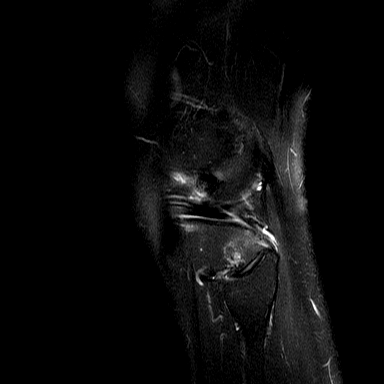
[im 25/25]
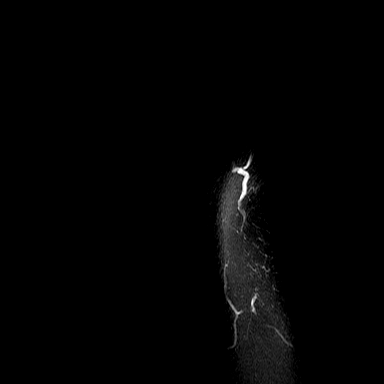

[33 of 40 positions shown; findings below may reference images not displayed]

FINDINGS: There is scarring in Hoffa's fat pad suggesting previous
arthroscopy. The body
and posterior horn of the medial meniscus are attenuated and
truncated. This
could reflect a partial medial meniscectomy. A recurrent
tear, however, can have
a similar appearance.

The lateral meniscus demonstrates normal signal intensity
and morphology.

The cruciate ligaments, collateral ligaments, and extensor
mechanism are intact.

There is full-thickness chondral loss along the
weightbearing surface of the
medial femoral condyle and medial tibial plateau measuring
27 mm transverse by
43 mm anterior to posterior. There is reactive subchondral
marrow edema and
small marginal osteophytes. The chondral surfaces of the
lateral compartment are
relatively well-preserved with the exception of mild
delamination along the
medial aspect of the lateral tibial plateau. There is
patellofemoral arthrosis
with full-thickness chondral loss along the median ridge and
medial patellar
facet measuring 20 mm transverse by 25 mm cranial caudal.
There is chondral
thinning along the mid to medial trochlea.

There is a small joint effusion.

There is a small popliteal cyst.

There is reactive marrow edema in the medial femoral condyle
and medial tibial
plateau.

The muscle signal intensity is normal.

The proximal tibiofibular joint is intact. The major
structures of the
posterolateral corner are unremarkable.
IMPRESSION: No visualized PVNS.

Scarring in Hoffa's fat pad suggesting previous arthroscopy.
The medial meniscus
is truncated and attenuated. The primary differential
consideration is a partial
medial meniscectomy. A recurrent tear, however, can have a
similar appearance.

Advanced medial compartment arthrosis with full-thickness
chondral loss and
reactive subchondral marrow edema.
